# Patient Record
Sex: Male | Born: 1970 | Race: White | Hispanic: No | Marital: Married | Smoking: Never smoker
Health system: Southern US, Community
[De-identification: ages and names within clinical notes are randomized; demographics above are authoritative.]

## PROBLEM LIST (undated history)

## (undated) DIAGNOSIS — E785 Hyperlipidemia, unspecified: Secondary | ICD-10-CM

## (undated) DIAGNOSIS — E119 Type 2 diabetes mellitus without complications: Secondary | ICD-10-CM

## (undated) HISTORY — DX: Type 2 diabetes mellitus without complications: E11.9

## (undated) HISTORY — PX: HERNIA REPAIR: SHX51

## (undated) HISTORY — DX: Hyperlipidemia, unspecified: E78.5

## (undated) HISTORY — PX: VASECTOMY: SHX75

---

## 2011-04-18 ENCOUNTER — Inpatient Hospital Stay (INDEPENDENT_AMBULATORY_CARE_PROVIDER_SITE_OTHER)
Admission: RE | Admit: 2011-04-18 | Discharge: 2011-04-18 | Disposition: A | Discharge status: Home / Self Care | Payer: BC Managed Care – PPO | Source: Ambulatory Visit | Attending: Emergency Medicine | Admitting: Emergency Medicine

## 2011-04-18 DIAGNOSIS — J019 Acute sinusitis, unspecified: Secondary | ICD-10-CM

## 2011-04-18 DIAGNOSIS — J40 Bronchitis, not specified as acute or chronic: Secondary | ICD-10-CM

## 2014-01-06 ENCOUNTER — Emergency Department (HOSPITAL_COMMUNITY)
Admission: EM | Admit: 2014-01-06 | Discharge: 2014-01-06 | Disposition: A | Discharge status: Home / Self Care | Payer: 59 | Source: Home / Self Care

## 2014-01-06 ENCOUNTER — Emergency Department (INDEPENDENT_AMBULATORY_CARE_PROVIDER_SITE_OTHER): Payer: BC Managed Care – PPO

## 2014-01-06 ENCOUNTER — Encounter (HOSPITAL_COMMUNITY): Payer: Self-pay | Admitting: Emergency Medicine

## 2014-01-06 DIAGNOSIS — W101XXA Fall (on)(from) sidewalk curb, initial encounter: Secondary | ICD-10-CM

## 2014-01-06 DIAGNOSIS — Y93E9 Activity, other interior property and clothing maintenance: Secondary | ICD-10-CM

## 2014-01-06 DIAGNOSIS — S2249XA Multiple fractures of ribs, unspecified side, initial encounter for closed fracture: Secondary | ICD-10-CM

## 2014-01-06 DIAGNOSIS — R9389 Abnormal findings on diagnostic imaging of other specified body structures: Secondary | ICD-10-CM

## 2014-01-06 DIAGNOSIS — R918 Other nonspecific abnormal finding of lung field: Secondary | ICD-10-CM

## 2014-01-06 MED ORDER — HYDROCODONE-ACETAMINOPHEN 7.5-325 MG PO TABS: 1.0000 | ORAL_TABLET | ORAL | Status: DC | PRN

## 2014-01-06 NOTE — ED Provider Notes (Signed)
Medical screening examination/treatment/procedure(s) were performed by a resident physician or non-physician practitioner and as the supervising physician I was immediately available for consultation/collaboration.  Lynne Leader, MD    Gregor Hams, MD 01/06/14 [phone]

## 2014-01-06 NOTE — ED Provider Notes (Signed)
CSN: [id number]     Arrival date & time 01/06/14  0904 History   First MD Initiated Contact with Patient 01/06/14 [phone]     Chief Complaint  Patient presents with  . Chest Pain    ribs   (Consider location/radiation/quality/duration/timing/severity/associated sxs/prior Treatment) HPI Comments: 43 year old male states he fell onto the curb 2 weeks ago while taking the trash out. He fell onto his right lateral chest where he incurred rib injury and has been having pain since then. He states the pain has been tolerable during this time until last night when he fell again going up the stairs carrying a laundry basket. He again, fell against the laundry basket with his right anterolateral chest. He points to the mid anterolateral chest wall as the source of pain. Denies shortness of breath it does hurt to take a deep breath. Denies fever or cough.   History reviewed. No pertinent past medical history. History reviewed. No pertinent past surgical history. No family history on file. History  Substance Use Topics  . Smoking status: Not on file  . Smokeless tobacco: Not on file  . Alcohol Use: Not on file    Review of Systems  Constitutional: Positive for activity change.  Respiratory: Negative.  Negative for cough, chest tightness and shortness of breath.   Cardiovascular:       As per history of present illness  Gastrointestinal: Negative.   Genitourinary: Negative.   Musculoskeletal:       As per HPI  Skin: Negative.   Neurological: Negative for dizziness, weakness, numbness and headaches.    Allergies  Review of patient's allergies indicates no known allergies.  Home Medications   Prior to Admission medications   Not on File   BP 124/88  Pulse 78  Temp(Src) 97.5 F (36.4 C) (Oral)  SpO2 96% Physical Exam  Nursing note and vitals reviewed. Constitutional: He is oriented to person, place, and time. He appears well-developed and well-nourished.  HENT:  Head: Normocephalic  and atraumatic.  Eyes: EOM are normal.  Neck: Normal range of motion. Neck supple.  Cardiovascular: Normal rate, regular rhythm and normal heart sounds.   Pulmonary/Chest: Effort normal and breath sounds normal. No respiratory distress. He has no wheezes. He has no rales. He exhibits tenderness.  Tenderness along the right chest wall just inferior to the pectoralis major and extending to the mid axillary line.  Abdominal: Soft. There is no tenderness.  Neurological: He is alert and oriented to person, place, and time. No cranial nerve deficit. He exhibits normal muscle tone.  Skin: Skin is warm and dry.  Psychiatric: He has a normal mood and affect.    ED Course  Procedures (including critical care time) Labs Review Labs Reviewed - No data to display  Imaging Review Dg Ribs Unilateral W/chest Right  01/06/2014   CLINICAL DATA:  Right-sided chest trauma and rib pain.  EXAM: RIGHT RIBS AND CHEST - 3+ VIEW  COMPARISON:  None.  FINDINGS: Minimally displaced fractures of the right anterior fifth and sixth ribs are noted.  No evidence of pneumothorax or hemothorax. Both lungs are clear. Heart size is normal. No evidence of mediastinal widening or tracheal deviation.  Nodular density projecting over the AP window may represent mediastinal mass, lymphadenopathy, or pulmonary nodule. Chest CT with contrast recommended for further evaluation.  IMPRESSION: Minimally displaced fractures of the right anterior fifth and sixth ribs. No evidence of pneumothorax or hemothorax.  Nodular density projecting over the AP window could be due  to mediastinal mass, lymphadenopathy, or pulmonary nodule. Chest CT with contrast recommended for further evaluation.   Electronically Signed   By: Earle Gell M.D.   On: 01/06/2014 10:00     MDM   1. Multiple closed fractures of ribs   2. Abnormal finding on chest xray     Norco for pain #20 Deep breathing Pt aware of mass on CXR, to see PCP next week For fever,  dyspnea, cough or other problems seek medical attn promptly.     Janne Napoleon, NP 01/06/14 1025

## 2014-01-06 NOTE — ED Notes (Signed)
Patient states fell about two weeks ago and injured his right side Yesterday he fell up the steps carrying the laundry basket and re injured the ribs on the  Right side. Today complains of being in pain

## 2014-01-06 NOTE — Discharge Instructions (Signed)
Rib Fracture A rib fracture is a break or crack in one of the bones of the ribs. The ribs are a group of long, curved bones that wrap around your chest and attach to your spine. They protect your lungs and other organs in the chest cavity. A broken or cracked rib is often painful, but most do not cause other problems. Most rib fractures heal on their own over time. However, rib fractures can be more serious if multiple ribs are broken or if broken ribs move out of place and push against other structures. CAUSES   A direct blow to the chest. For example, this could happen during contact sports, a car accident, or a fall against a hard object.  Repetitive movements with high force, such as pitching a baseball or having severe coughing spells. SYMPTOMS   Pain when you breathe in or cough.  Pain when someone presses on the injured area. DIAGNOSIS  Your caregiver will perform a physical exam. Various imaging tests may be ordered to confirm the diagnosis and to look for related injuries. These tests may include a chest X-ray, computed tomography (CT), magnetic resonance imaging (MRI), or a bone scan. TREATMENT  Rib fractures usually heal on their own in 1 3 months. The longer healing period is often associated with a continued cough or other aggravating activities. During the healing period, pain control is very important. Medication is usually given to control pain. Hospitalization or surgery may be needed for more severe injuries, such as those in which multiple ribs are broken or the ribs have moved out of place.  HOME CARE INSTRUCTIONS  Also ibuprofen as needed for pain  Avoid strenuous activity and any activities or movements that cause pain. Be careful during activities and avoid bumping the injured rib.  Gradually increase activity as directed by your caregiver.  Only take over-the-counter or prescription medications as directed by your caregiver. Do not take other medications without asking  your caregiver first.  Apply ice to the injured area for the first 1 2 days after you have been treated or as directed by your caregiver. Applying ice helps to reduce inflammation and pain.  Put ice in a plastic bag.  Place a towel between your skin and the bag.   Leave the ice on for 15 20 minutes at a time, every 2 hours while you are awake.  Perform deep breathing as directed by your caregiver. This will help prevent pneumonia, which is a common complication of a broken rib. Your caregiver may instruct you to:  Take deep breaths several times a day.  Try to cough several times a day, holding a pillow against the injured area.  Use a device called an incentive spirometer to practice deep breathing several times a day.  Drink enough fluids to keep your urine clear or pale yellow. This will help you avoid constipation.   Do not wear a rib belt or binder. These restrict breathing, which can lead to pneumonia.  SEEK IMMEDIATE MEDICAL CARE IF:   You have a fever.   You have difficulty breathing or shortness of breath.   You develop a continual cough, or you cough up thick or bloody sputum.  You feel sick to your stomach (nausea), throw up (vomit), or have abdominal pain.   You have worsening pain not controlled with medications.  MAKE SURE YOU:  Understand these instructions.  Will watch your condition.  Will get help right away if you are not doing well or get  worse. Document Released: 08/16/2005 Document Revised: 04/18/2013 Document Reviewed: 10/18/2012 [facility] Patient Information 2014 [place], [place].

## 2014-01-14 ENCOUNTER — Other Ambulatory Visit: Payer: Self-pay | Admitting: Family Medicine

## 2014-01-14 DIAGNOSIS — R9389 Abnormal findings on diagnostic imaging of other specified body structures: Secondary | ICD-10-CM

## 2014-01-15 ENCOUNTER — Ambulatory Visit
Admission: RE | Admit: 2014-01-15 | Discharge: 2014-01-15 | Disposition: A | Discharge status: Home / Self Care | Payer: 59 | Source: Ambulatory Visit | Attending: Family Medicine | Admitting: Family Medicine

## 2014-01-15 DIAGNOSIS — R9389 Abnormal findings on diagnostic imaging of other specified body structures: Secondary | ICD-10-CM

## 2014-01-15 MED ORDER — IOHEXOL 300 MG/ML  SOLN
75.0000 mL | Freq: Once | INTRAMUSCULAR | Status: AC | PRN
  Administered 2014-01-15: 75 mL via INTRAVENOUS

## 2014-06-03 ENCOUNTER — Other Ambulatory Visit: Payer: Self-pay | Admitting: *Deleted

## 2014-06-03 DIAGNOSIS — I83893 Varicose veins of bilateral lower extremities with other complications: Secondary | ICD-10-CM

## 2014-06-06 ENCOUNTER — Ambulatory Visit (INDEPENDENT_AMBULATORY_CARE_PROVIDER_SITE_OTHER): Payer: 59 | Admitting: Psychiatry

## 2014-06-06 DIAGNOSIS — F1210 Cannabis abuse, uncomplicated: Secondary | ICD-10-CM

## 2014-06-06 DIAGNOSIS — F329 Major depressive disorder, single episode, unspecified: Secondary | ICD-10-CM

## 2014-06-11 ENCOUNTER — Ambulatory Visit (INDEPENDENT_AMBULATORY_CARE_PROVIDER_SITE_OTHER): Payer: 59 | Admitting: Psychiatry

## 2014-06-11 ENCOUNTER — Ambulatory Visit: Payer: 59 | Admitting: Professional

## 2014-06-11 DIAGNOSIS — F1010 Alcohol abuse, uncomplicated: Secondary | ICD-10-CM

## 2014-06-11 DIAGNOSIS — F329 Major depressive disorder, single episode, unspecified: Secondary | ICD-10-CM

## 2014-06-20 ENCOUNTER — Ambulatory Visit (INDEPENDENT_AMBULATORY_CARE_PROVIDER_SITE_OTHER): Payer: 59 | Admitting: Psychiatry

## 2014-06-20 DIAGNOSIS — F329 Major depressive disorder, single episode, unspecified: Secondary | ICD-10-CM

## 2014-06-20 DIAGNOSIS — F1010 Alcohol abuse, uncomplicated: Secondary | ICD-10-CM

## 2014-06-24 ENCOUNTER — Encounter: Payer: Self-pay | Admitting: Vascular Surgery

## 2014-06-25 ENCOUNTER — Encounter (HOSPITAL_COMMUNITY): Payer: 59

## 2014-06-25 ENCOUNTER — Encounter: Payer: 59 | Admitting: Vascular Surgery

## 2014-07-04 ENCOUNTER — Ambulatory Visit (INDEPENDENT_AMBULATORY_CARE_PROVIDER_SITE_OTHER): Payer: 59 | Admitting: Psychiatry

## 2014-07-04 DIAGNOSIS — F39 Unspecified mood [affective] disorder: Secondary | ICD-10-CM

## 2014-07-04 DIAGNOSIS — F1010 Alcohol abuse, uncomplicated: Secondary | ICD-10-CM

## 2016-01-27 IMAGING — CT CT CHEST W/ CM
2 of 3 series · 15 of 36 positions shown, 18 images · IV contrast (75CC OMNI 300)
Comparison: Chest radiograph January 06, 2014

CLINICAL DATA: Pulmonary nodular lesions seen on chest radiograph

EXAM:
CT CHEST WITH CONTRAST
TECHNIQUE: Multidetector CT imaging of the chest was performed during
intravenous contrast administration.
CONTRAST:  75mL OMNIPAQUE IOHEXOL 300 MG/ML  SOLN

[Series 2: chest with · axial · 0.76mm/px · z∈[-336,-61]mm · 12 of 65 slices shown, 15 images]
[im 5/65  mediastinal]
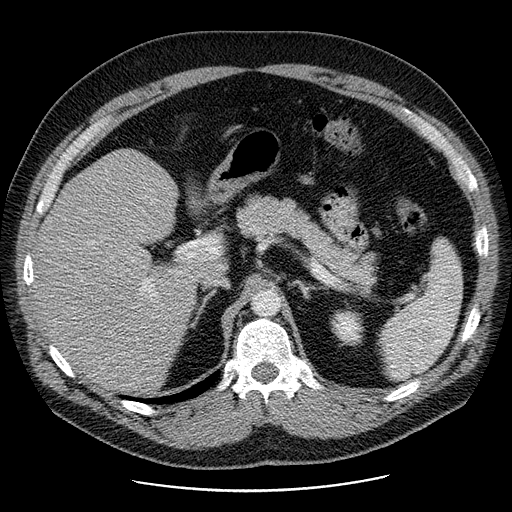
[im 5/65  lung]
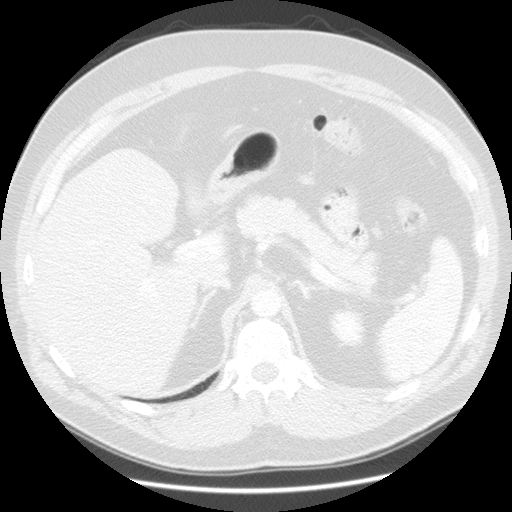
[im 10/65  lung]
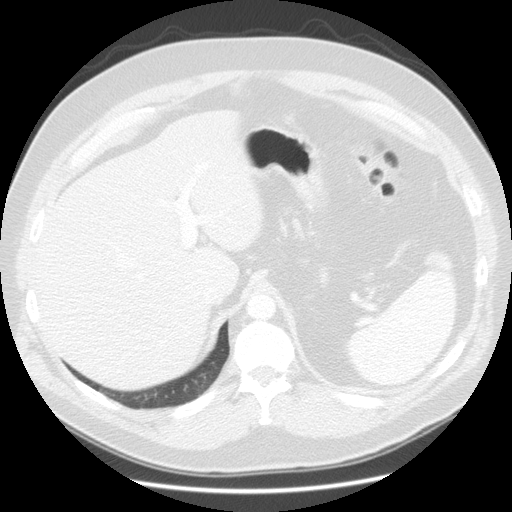
[im 15/65  lung]
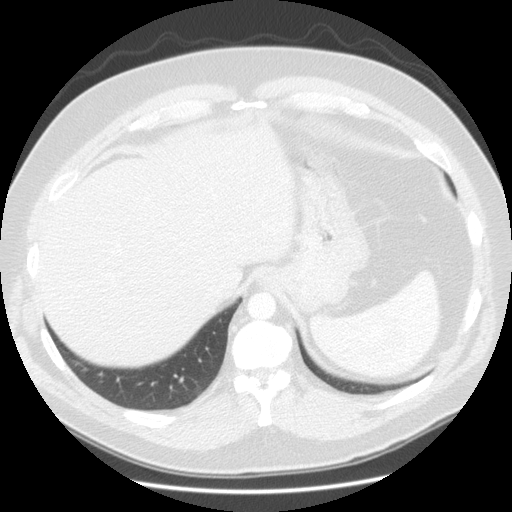
[im 19/65  lung]
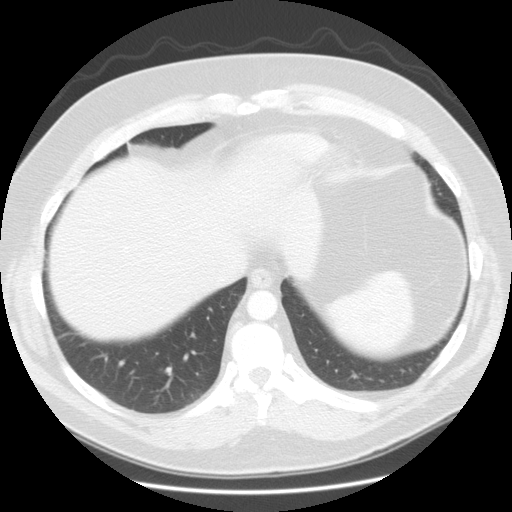
[im 24/65  mediastinal]
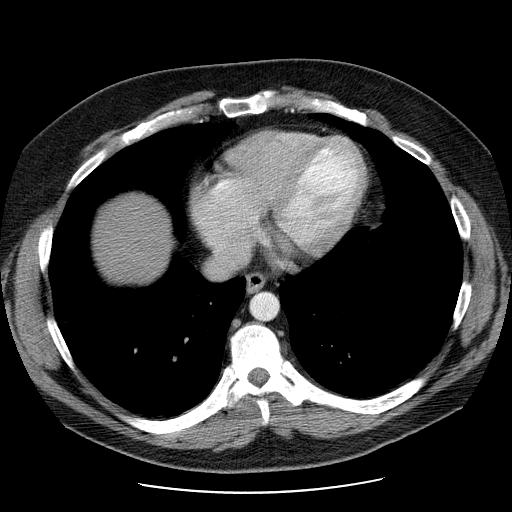
[im 24/65  lung]
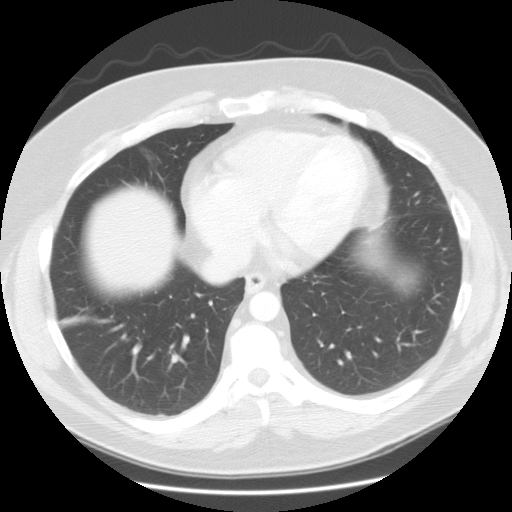
[im 29/65  lung]
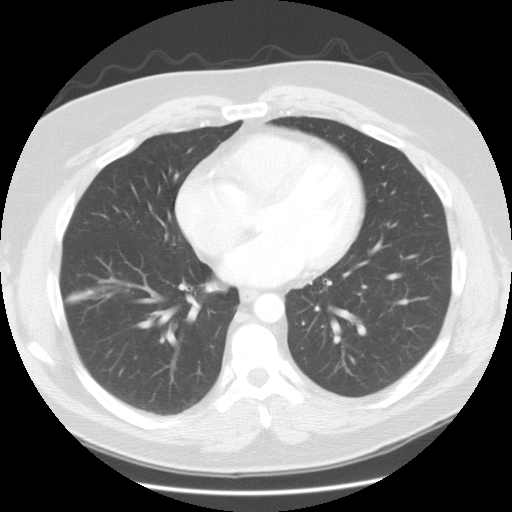
[im 36/65  lung]
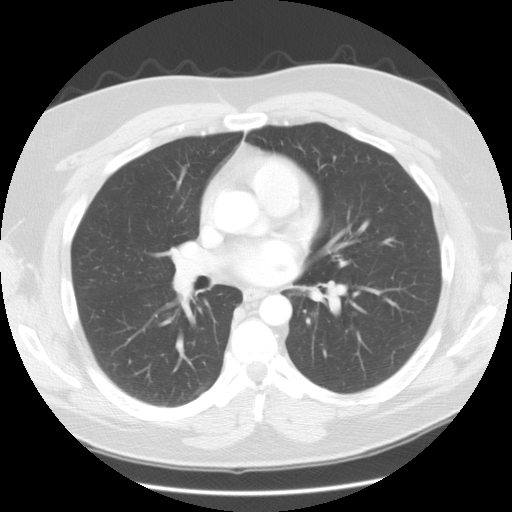
[im 41/65  lung]
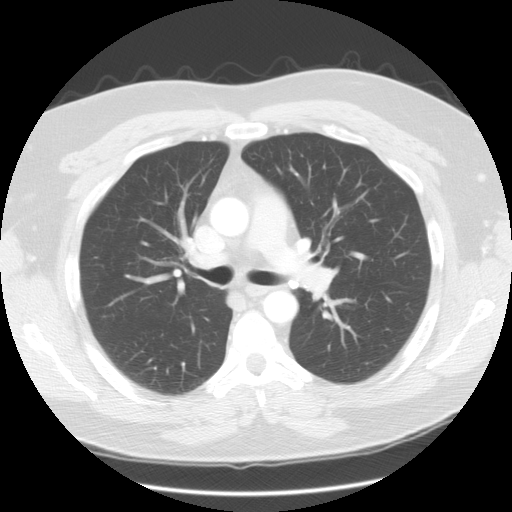
[im 46/65  mediastinal]
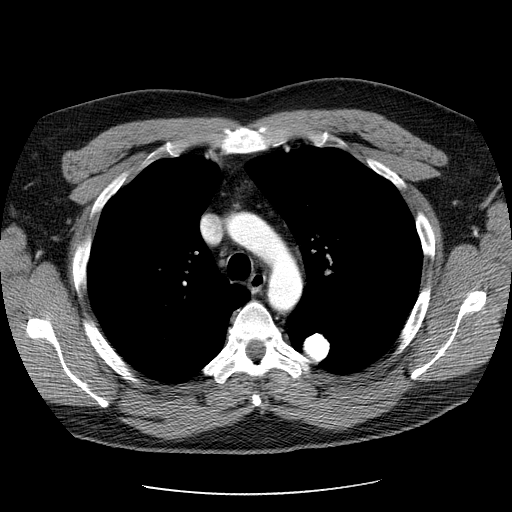
[im 46/65  lung]
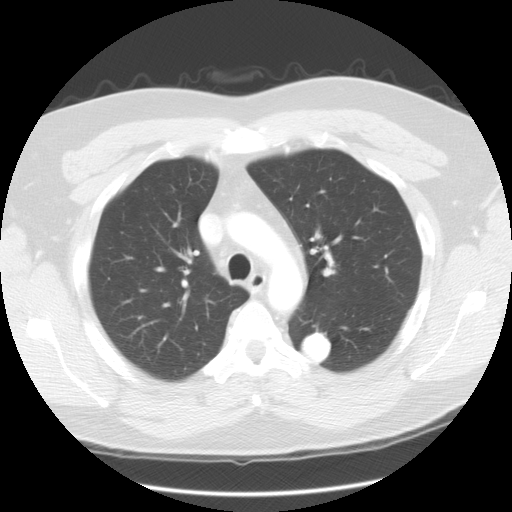
[im 50/65  lung]
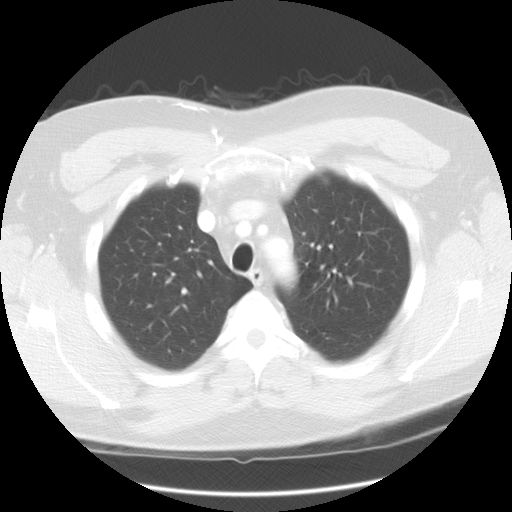
[im 55/65  lung]
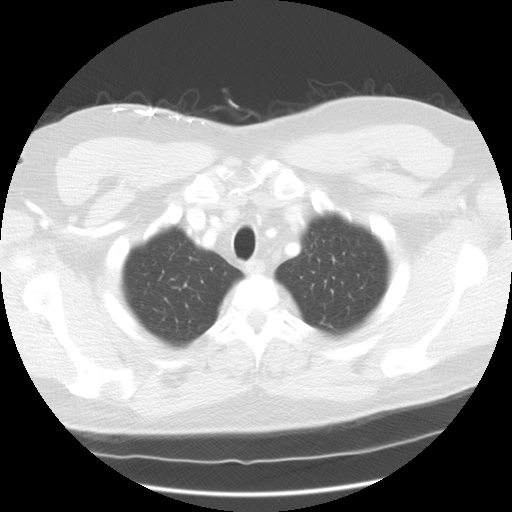
[im 60/65  lung]
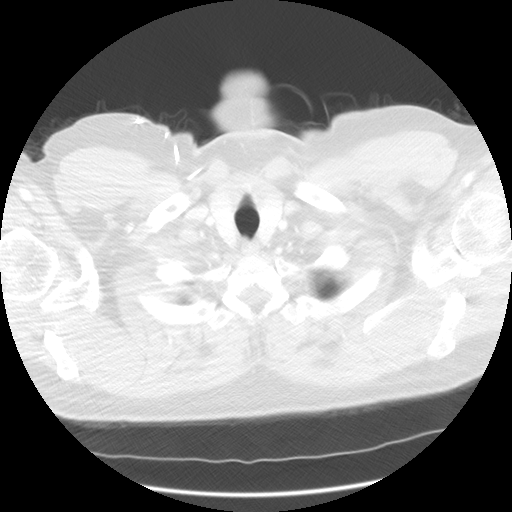

[Series 402: sag · coronal · 0.76mm/px · 3 of 157 slices shown]
[im 32/157  lung]
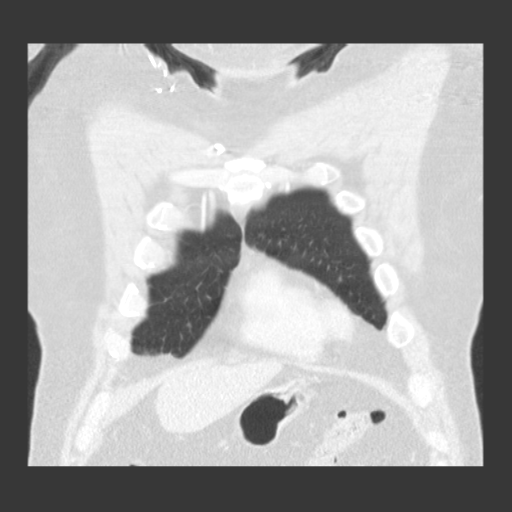
[im 63/157  lung]
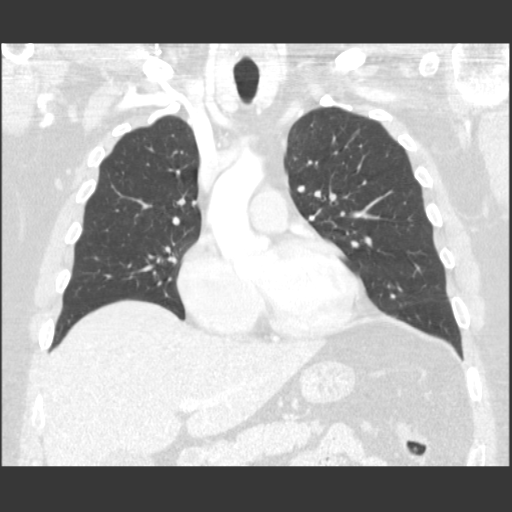
[im 94/157  lung]
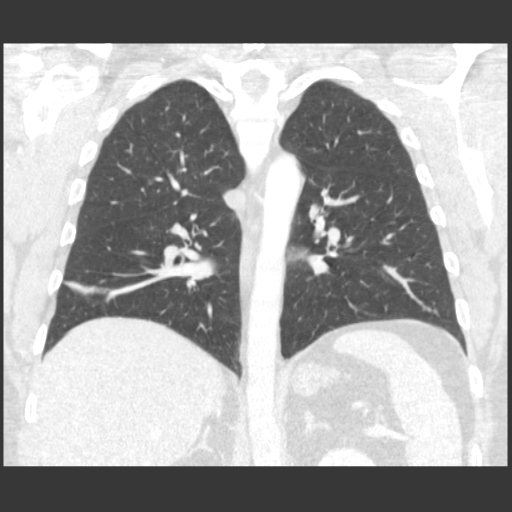

[15 of 36 positions shown; findings below may reference images not displayed]

FINDINGS: There is a 2.1 x 2.1 cm calcified granuloma in the posterior segment
of the left upper lobe which corresponds to the opacity seen on
recent chest radiograph. There is a small area of scarring in the
posterior segment of the left upper lobe. There is mild scarring in
the right base. Elsewhere, lungs are clear.

There is no appreciable thoracic adenopathy. The pericardium is not
thickened.

Visualized upper abdominal structures appear within normal limits.
There are no blastic or lytic bone lesions. Thyroid appears normal.
IMPRESSION: The area question on recent chest radiograph represents a calcified
granuloma in the posterior segment of the left upper lobe. Areas of
mild scarring are noted bilaterally. There is no edema or
consolidation. No adenopathy.

## 2016-11-03 DIAGNOSIS — R05 Cough: Secondary | ICD-10-CM | POA: Diagnosis not present

## 2016-11-03 DIAGNOSIS — J209 Acute bronchitis, unspecified: Secondary | ICD-10-CM | POA: Diagnosis not present

## 2016-11-03 DIAGNOSIS — R911 Solitary pulmonary nodule: Secondary | ICD-10-CM | POA: Diagnosis not present

## 2017-05-08 DIAGNOSIS — R45851 Suicidal ideations: Secondary | ICD-10-CM | POA: Diagnosis not present

## 2017-05-08 DIAGNOSIS — F29 Unspecified psychosis not due to a substance or known physiological condition: Secondary | ICD-10-CM | POA: Diagnosis not present

## 2017-05-08 DIAGNOSIS — Z79899 Other long term (current) drug therapy: Secondary | ICD-10-CM | POA: Diagnosis not present

## 2017-05-08 DIAGNOSIS — F4321 Adjustment disorder with depressed mood: Secondary | ICD-10-CM | POA: Diagnosis not present

## 2017-05-08 DIAGNOSIS — Z79891 Long term (current) use of opiate analgesic: Secondary | ICD-10-CM | POA: Diagnosis not present

## 2017-05-08 DIAGNOSIS — F329 Major depressive disorder, single episode, unspecified: Secondary | ICD-10-CM | POA: Diagnosis not present

## 2017-05-08 DIAGNOSIS — Z638 Other specified problems related to primary support group: Secondary | ICD-10-CM | POA: Diagnosis not present

## 2017-05-08 DIAGNOSIS — R739 Hyperglycemia, unspecified: Secondary | ICD-10-CM | POA: Diagnosis not present

## 2017-05-08 DIAGNOSIS — G4700 Insomnia, unspecified: Secondary | ICD-10-CM | POA: Diagnosis not present

## 2017-05-11 ENCOUNTER — Ambulatory Visit (INDEPENDENT_AMBULATORY_CARE_PROVIDER_SITE_OTHER): Payer: BLUE CROSS/BLUE SHIELD | Admitting: Osteopathic Medicine

## 2017-05-11 ENCOUNTER — Encounter: Payer: Self-pay | Admitting: Osteopathic Medicine

## 2017-05-11 VITALS — BP 122/76 | HR 79 | Ht 73.0 in | Wt 243.0 lb

## 2017-05-11 DIAGNOSIS — E1165 Type 2 diabetes mellitus with hyperglycemia: Secondary | ICD-10-CM | POA: Diagnosis not present

## 2017-05-11 DIAGNOSIS — R7303 Prediabetes: Secondary | ICD-10-CM

## 2017-05-11 DIAGNOSIS — F332 Major depressive disorder, recurrent severe without psychotic features: Secondary | ICD-10-CM

## 2017-05-11 DIAGNOSIS — IMO0001 Reserved for inherently not codable concepts without codable children: Secondary | ICD-10-CM

## 2017-05-11 DIAGNOSIS — Z23 Encounter for immunization: Secondary | ICD-10-CM

## 2017-05-11 DIAGNOSIS — IMO0002 Reserved for concepts with insufficient information to code with codable children: Secondary | ICD-10-CM | POA: Insufficient documentation

## 2017-05-11 LAB — POCT GLYCOSYLATED HEMOGLOBIN (HGB A1C): Hemoglobin A1C: 12.3

## 2017-05-11 MED ORDER — BLOOD GLUCOSE MONITOR KIT: PACK | 0 refills | Status: AC

## 2017-05-11 MED ORDER — LIRAGLUTIDE 18 MG/3ML ~~LOC~~ SOPN: PEN_INJECTOR | SUBCUTANEOUS | 3 refills | Status: DC

## 2017-05-11 MED ORDER — METFORMIN HCL ER 500 MG PO TB24: 500.0000 mg | ORAL_TABLET | Freq: Two times a day (BID) | ORAL | 1 refills | Status: DC

## 2017-05-11 MED ORDER — BUPROPION HCL ER (XL) 150 MG PO TB24: 150.0000 mg | ORAL_TABLET | Freq: Every day | ORAL | 1 refills | Status: DC

## 2017-05-11 NOTE — Patient Instructions (Addendum)
Plan:  Diabetes - metformin (start once per day and increase as tolerated to twice per day) and liraglutide (see dosing instructions on prescription) and referral to dietician, see below. Goal fasting sugars 80-130.   Depression - wellbutrin and referral to psychiatry and counseling. If ever in crisis: 911, national suicide hotline [phone]), closest emergency room      Carbohydrate Counting for Diabetes Mellitus, Adult Carbohydrate counting is a method for keeping track of how many carbohydrates you eat. Eating carbohydrates naturally increases the amount of sugar (glucose) in the blood. Counting how many carbohydrates you eat helps keep your blood glucose within normal limits, which helps you manage your diabetes (diabetes mellitus). It is important to know how many carbohydrates you can safely have in each meal. This is different for every person. A diet and nutrition specialist (registered dietitian) can help you make a meal plan and calculate how many carbohydrates you should have at each meal and snack. Carbohydrates are found in the following foods:  Grains, such as breads and cereals.  Dried beans and soy products.  Starchy vegetables, such as potatoes, peas, and corn.  Fruit and fruit juices.  Milk and yogurt.  Sweets and snack foods, such as cake, cookies, candy, chips, and soft drinks.  How do I count carbohydrates? There are two ways to count carbohydrates in food. You can use either of the methods or a combination of both. Reading "Nutrition Facts" on packaged food The "Nutrition Facts" list is included on the labels of almost all packaged foods and beverages in the [place] It includes:  The serving size.  Information about nutrients in each serving, including the grams (g) of carbohydrate per serving.  To use the "Nutrition Facts":  Decide how many servings you will have.  Multiply the number of servings by the number of carbohydrates per serving.  The  resulting number is the total amount of carbohydrates that you will be having.  Learning standard serving sizes of other foods When you eat foods containing carbohydrates that are not packaged or do not include "Nutrition Facts" on the label, you need to measure the servings in order to count the amount of carbohydrates:  Measure the foods that you will eat with a food scale or measuring cup, if needed.  Decide how many standard-size servings you will eat.  Multiply the number of servings by 15. Most carbohydrate-rich foods have about 15 g of carbohydrates per serving. ? For example, if you eat 8 oz (170 g) of strawberries, you will have eaten 2 servings and 30 g of carbohydrates (2 servings x 15 g = 30 g).  For foods that have more than one food mixed, such as soups and casseroles, you must count the carbohydrates in each food that is included.  The following list contains standard serving sizes of common carbohydrate-rich foods. Each of these servings has about 15 g of carbohydrates:   hamburger bun or  English muffin.   oz (15 mL) syrup.   oz (14 g) jelly.  1 slice of bread.  1 six-inch tortilla.  3 oz (85 g) cooked rice or pasta.  4 oz (113 g) cooked dried beans.  4 oz (113 g) starchy vegetable, such as peas, corn, or potatoes.  4 oz (113 g) hot cereal.  4 oz (113 g) mashed potatoes or  of a large baked potato.  4 oz (113 g) canned or frozen fruit.  4 oz (120 mL) fruit juice.  4-6 crackers.  6 chicken nuggets.  6 oz (170 g) unsweetened dry cereal.  6 oz (170 g) plain fat-free yogurt or yogurt sweetened with artificial sweeteners.  8 oz (240 mL) milk.  8 oz (170 g) fresh fruit or one small piece of fruit.  24 oz (680 g) popped popcorn.  Example of carbohydrate counting Sample meal  3 oz (85 g) chicken breast.  6 oz (170 g) brown rice.  4 oz (113 g) corn.  8 oz (240 mL) milk.  8 oz (170 g) strawberries with sugar-free whipped  topping. Carbohydrate calculation 1. Identify the foods that contain carbohydrates: ? Rice. ? Corn. ? Milk. ? Strawberries. 2. Calculate how many servings you have of each food: ? 2 servings rice. ? 1 serving corn. ? 1 serving milk. ? 1 serving strawberries. 3. Multiply each number of servings by 15 g: ? 2 servings rice x 15 g = 30 g. ? 1 serving corn x 15 g = 15 g. ? 1 serving milk x 15 g = 15 g. ? 1 serving strawberries x 15 g = 15 g. 4. Add together all of the amounts to find the total grams of carbohydrates eaten: ? 30 g + 15 g + 15 g + 15 g = 75 g of carbohydrates total. This information is not intended to replace advice given to you by your health care provider. Make sure you discuss any questions you have with your health care provider. Document Released: 08/16/2005 Document Revised: 03/05/2016 Document Reviewed: 01/28/2016 Elsevier Interactive Patient Education  [employer].

## 2017-05-11 NOTE — Progress Notes (Signed)
HPI: Roy Meyers is a 46 y.o. male  who presents to [place] today, 05/11/17,  for chief complaint of:  Chief Complaint  Patient presents with  . Establish Care    New patient here to establish care  Concern for diabetes: Family history of prediabetes in father. He has some concerns about some nerve pain that he has been having in his legs. A1c as below does diagnosis diabetes today.  Depression: Ongoing on and off for several years but has reached a breaking point with significant stress levels and mood problems over the past week or so. Was evaluated in the emergency department last weekend, released. Has 3 children, motivated to seek counseling and no suicidal thoughts at this time. He is answering the PHQ9 questions based on feelings over the past 2 weeks but currently no suicidal thoughts or plan. Good family support system   Past medical, surgical, social and family history reviewed: Patient Active Problem List   Diagnosis Date Noted  . Uncontrolled type 2 diabetes mellitus without complication, without long-term current use of insulin ([place]) 05/11/2017  . Severe episode of recurrent major depressive disorder, without psychotic features () 05/11/2017   Past Surgical History:  Procedure Laterality Date  . HERNIA REPAIR     Social History  Substance Use Topics  . Smoking status: Never Smoker  . Smokeless tobacco: Never Used  . Alcohol use Not on file   Family History  Problem Relation Age of Onset  . Alcohol abuse Mother   . Cancer Mother   . Diabetes Father      Current medication list and allergy/intolerance information reviewed:   No current outpatient prescriptions on file.   No current facility-administered medications for this visit.    No Known Allergies    Review of Systems:  Constitutional:  No  fever, no chills, No recent illness, No unintentional weight changes. No significant fatigue.   HEENT: No  headache,  no vision change, no hearing change, No sore throat, No  sinus pressure  Cardiac: No  chest pain, No  pressure, No palpitations, No  Orthopnea  Respiratory:  No  shortness of breath. No  Cough  Gastrointestinal: No  abdominal pain, No  nausea, No  vomiting,  No  blood in stool, No  diarrhea, No  constipation   Musculoskeletal: No new myalgia/arthralgia  Genitourinary: No  incontinence, No  abnormal genital bleeding, No abnormal genital discharge  Skin: No  Rash, No other wounds/concerning lesions  Hem/Onc: No  easy bruising/bleeding, No  abnormal lymph node  Endocrine: No cold intolerance,  No heat intolerance. No polyuria/polydipsia/polyphagia   Neurologic: No  weakness, No  dizziness, No  slurred speech/focal weakness/facial droop  Psychiatric: +concerns with depression, No  concerns with anxiety, No sleep problems, No mood problems  Exam:  BP 122/76   Pulse 79   Ht 6' 1"  (1.854 m)   Wt 243 lb (110.2 kg)   BMI 32.06 kg/m   Constitutional: VS see above. General Appearance: alert, well-developed, well-nourished, NAD  Neck: No masses, trachea midline. No thyroid enlargement.   Respiratory: Normal respiratory effort. no wheeze, no rhonchi, no rales  Cardiovascular: S1/S2 normal, no murmur, no rub/gallop auscultated. RRR. No lower extremity edema.   Musculoskeletal: Gait normal.   Neurological: Normal balance/coordination. No tremor.   Skin: warm, dry, intact. No rash/ulcer.  Psychiatric: Normal judgment/insight. Normal mood and affect. Oriented x3.    Results for orders placed or performed in visit on 05/11/17 (from  the past 72 hour(s))  POCT HgB A1C     Status: None   Collection Time: 05/11/17  2:04 PM  Result Value Ref Range   Hemoglobin A1C 12.3       ASSESSMENT/PLAN: A diagnosis of diabetes, particularly elevated A1c, will confirm with serum blood draw. Will go ahead and initiate double therapy, likely will need triple, we'll see how well he tolerates  addition of these medications in addition to an antidepressant. Safety plan discussed regarding depression, patient is motivated to get help and I do not believe he is a danger to himself.  Severe episode of recurrent major depressive disorder, without psychotic features ([place]) - Plan: buPROPion (WELLBUTRIN XL) 150 MG 24 hr tablet, Ambulatory referral to [place], TSH, Testosterone  Uncontrolled type 2 diabetes mellitus without complication, without long-term current use of insulin ([place]) - Plan: metFORMIN (GLUCOPHAGE-XR) 500 MG 24 hr tablet, liraglutide 18 MG/3ML SOPN, blood glucose meter kit and supplies KIT, Lipid panel, Hemoglobin A1c, Amb ref to Medical Nutrition Therapy-MNT  Need for immunization against influenza - Plan: Flu Vaccine QUAD 36+ mos IM  Pre-diabetes - Plan: POCT HgB A1C    Patient Instructions  Plan:  Diabetes - metformin (start once per day and increase as tolerated to twice per day) and liraglutide (see dosing instructions on prescription) and referral to dietician, see below. Goal fasting sugars 80-130.   Depression - wellbutrin and referral to psychiatry and counseling. If ever in crisis: 911, national suicide hotline [phone]), closest emergency room      Visit summary with medication list and pertinent instructions was printed for patient to review. All questions at time of visit were answered - patient instructed to contact office with any additional concerns. ER/RTC precautions were reviewed with the patient. Follow-up plan: Return for recheck diabetes and depressionin 2-4 weeks, sooner if needed or if problems with medications .  Note: Total time spent 45 minutes, greater than 50% of the visit was spent face-to-face counseling and coordinating care for the following: The primary encounter diagnosis was Need for immunization against influenza. A diagnosis of Pre-diabetes was also pertinent to this visit.Marland Kitchen

## 2017-05-14 LAB — TSH: TSH: 1.24 mIU/L (ref 0.40–4.50)

## 2017-05-14 LAB — HEMOGLOBIN A1C
EAG (MMOL/L): 13.8 (calc)
HEMOGLOBIN A1C: 10.3 %{Hb} — AB (ref <5.7)
Mean Plasma Glucose: 249 (calc)

## 2017-05-14 LAB — LIPID PANEL
CHOLESTEROL: 225 mg/dL — AB (ref <200)
HDL: 40 mg/dL — AB (ref >40)
LDL Cholesterol (Calc): 134 mg/dL (calc) — ABNORMAL HIGH
Non-HDL Cholesterol (Calc): 185 mg/dL (calc) — ABNORMAL HIGH (ref <130)
Total CHOL/HDL Ratio: 5.6 (calc) — ABNORMAL HIGH (ref <5.0)
Triglycerides: 357 mg/dL — ABNORMAL HIGH (ref <150)

## 2017-05-14 LAB — TESTOSTERONE: Testosterone: 209 ng/dL — ABNORMAL LOW (ref 250–827)

## 2017-05-25 ENCOUNTER — Encounter: Payer: Self-pay | Admitting: Osteopathic Medicine

## 2017-05-25 ENCOUNTER — Ambulatory Visit (INDEPENDENT_AMBULATORY_CARE_PROVIDER_SITE_OTHER): Payer: BLUE CROSS/BLUE SHIELD | Admitting: Osteopathic Medicine

## 2017-05-25 VITALS — BP 122/84 | HR 96 | Ht 73.0 in | Wt 238.0 lb

## 2017-05-25 DIAGNOSIS — F332 Major depressive disorder, recurrent severe without psychotic features: Secondary | ICD-10-CM

## 2017-05-25 DIAGNOSIS — R7989 Other specified abnormal findings of blood chemistry: Secondary | ICD-10-CM

## 2017-05-25 DIAGNOSIS — E1165 Type 2 diabetes mellitus with hyperglycemia: Secondary | ICD-10-CM

## 2017-05-25 DIAGNOSIS — IMO0001 Reserved for inherently not codable concepts without codable children: Secondary | ICD-10-CM

## 2017-05-25 DIAGNOSIS — E1169 Type 2 diabetes mellitus with other specified complication: Secondary | ICD-10-CM | POA: Diagnosis not present

## 2017-05-25 DIAGNOSIS — E785 Hyperlipidemia, unspecified: Secondary | ICD-10-CM

## 2017-05-25 MED ORDER — ATORVASTATIN CALCIUM 40 MG PO TABS: 40.0000 mg | ORAL_TABLET | Freq: Every day | ORAL | 3 refills | Status: DC

## 2017-05-25 MED ORDER — BUPROPION HCL ER (XL) 300 MG PO TB24: 300.0000 mg | ORAL_TABLET | Freq: Every day | ORAL | 1 refills | Status: DC

## 2017-05-25 MED ORDER — LIRAGLUTIDE 18 MG/3ML ~~LOC~~ SOPN: 1.8000 mg | PEN_INJECTOR | Freq: Every day | SUBCUTANEOUS | 3 refills | Status: DC

## 2017-05-25 NOTE — Patient Instructions (Signed)
Plan:  Cholesterol medicine  Increase Wellbutrin  Refill pens and needles  Labs for testosterone and if still low will order shots - nurse visit to get first one

## 2017-05-25 NOTE — Progress Notes (Signed)
HPI: Roy Meyers is a 46 y.o. male  who presents to [facility] today, 05/25/17,  for chief complaint of:  Chief Complaint  Patient presents with  . Follow-up    LABS AND DISCUSS INCREASING ANTIDEPRESSANT MEDICATION   Diabetes: Recent diagnosis with A1c just above 10%. Patient would like to give lifestyle modifications and non-insulin medications a chance to work before going to insulin. He is open to statin medications.  Depression: Ongoing on and off for several years but has reached a breaking point with significant stress levels and mood problems as well as marital separation. Has 3 children, motivated to seek counseling and no suicidal thoughts. He is tolerating Wellbutrin okay but not noticing much difference in terms of mood issues, would like to go up on this medication.  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 Patient is interested in possible replacement/further workup.   Past medical, surgical, social and family history reviewed: Patient Active Problem List   Diagnosis Date Noted  . Uncontrolled type 2 diabetes mellitus without complication, without long-term current use of insulin ([place]) 05/11/2017  . Severe episode of recurrent major depressive disorder, without psychotic features ([facility]) 05/11/2017   Past Surgical History:  Procedure Laterality Date  . HERNIA REPAIR    . VASECTOMY     Social History  Substance Use Topics  . Smoking status: Never Smoker  . Smokeless tobacco: Never Used  . Alcohol use Not on file   Family History  Problem Relation Age of Onset  . Alcohol abuse Mother   . Cancer Mother   . Diabetes Father      Current medication list and allergy/intolerance information reviewed:   Current Outpatient Prescriptions  Medication Sig Dispense Refill  . blood glucose meter kit and supplies KIT Dispense based on patient and insurance preference. Use as directed up to bid. Dx:  E11.65 1 each 0  . buPROPion (WELLBUTRIN XL) 150 MG 24 hr tablet Take 1 tablet (150 mg total) by mouth daily. 30 tablet 1  . liraglutide 18 MG/3ML SOPN SubQ: Initial: 0.6 mg once daily for 1 week; then increase to 1.2 mg once daily for 1 week; then increase further to 1.8 mg once daily. 9 mL 3  . metFORMIN (GLUCOPHAGE-XR) 500 MG 24 hr tablet Take 1 tablet (500 mg total) by mouth 2 (two) times daily. 60 tablet 1   No current facility-administered medications for this visit.    No Known Allergies    Review of Systems:  Constitutional:  No  fever, no chills, No recent illness,  HEENT: No  headache  Cardiac: No  chest pain, No  pressure, No palpitations  Respiratory:  No  shortness of breath. No  Cough  Gastrointestinal: No  abdominal pain, No  nausea  Musculoskeletal: No new myalgia/arthralgia  Endocrine: No cold intolerance,  No heat intolerance. No polyuria/polydipsia/polyphagia   Neurologic: No  weakness, No  dizziness,  Psychiatric: +concerns with depression, No  concerns with anxiety, No sleep problems, No mood problems  Exam:  BP 122/84   Pulse 96   Ht _0  (1.854 m)   Wt 238 lb (108 kg)   BMI 31.40 kg/m   Constitutional: VS see above. General Appearance: alert, well-developed, well-nourished, NAD  Neck: No masses, trachea midline. No thyroid enlargement.   Respiratory: Normal respiratory effort. no wheeze, no rhonchi, no rales  Cardiovascular: S1/S2 normal, no murmur, no rub/gallop auscultated. RRR. No lower extremity edema.   Musculoskeletal:  Gait normal.   Neurological: Normal balance/coordination. No tremor.   Skin: warm, dry, intact. No rash/ulcer.  Psychiatric: Normal judgment/insight. Normal mood and affect. Oriented x3.    Recent Results (from the past 2160 hour(s))  POCT HgB A1C     Status: None   Collection Time: 05/11/17  2:04 PM  Result Value Ref Range   Hemoglobin A1C 12.3   Lipid panel     Status: Abnormal   Collection Time: 05/13/17   9:44 AM  Result Value Ref Range   Cholesterol 225 (H) <200 mg/dL   HDL 40 (L) >40 mg/dL   Triglycerides 357 (H) <150 mg/dL   LDL Cholesterol (Calc) 134 (H) mg/dL (calc)    Comment: Reference range: <100 . Desirable range <100 mg/dL for primary prevention;   <70 mg/dL for patients with CHD or diabetic patients  with > or = 2 CHD risk factors. Marland Kitchen LDL-C is now calculated using the Martin-Hopkins  calculation, which is a validated novel method providing  better accuracy than the Friedewald equation in the  estimation of LDL-C.  Cresenciano Genre et al. Annamaria Helling. 4696;295(28): 2061-2068  (http://education.[employer].com/faq/FAQ164)    Total CHOL/HDL Ratio 5.6 (H) <5.0 (calc)   Non-HDL Cholesterol (Calc) 185 (H) <130 mg/dL (calc)    Comment: For patients with diabetes plus 1 major ASCVD risk  factor, treating to a non-HDL-C goal of <100 mg/dL  (LDL-C of <70 mg/dL) is considered a therapeutic  option.   TSH     Status: None   Collection Time: 05/13/17  9:44 AM  Result Value Ref Range   TSH 1.24 0.40 - 4.50 mIU/L  Hemoglobin A1c     Status: Abnormal   Collection Time: 05/13/17  9:44 AM  Result Value Ref Range   Hgb A1c MFr Bld 10.3 (H) <5.7 % of total Hgb    Comment: For someone without known diabetes, a hemoglobin A1c value of 6.5% or greater indicates that they may have  diabetes and this should be confirmed with a follow-up  test. . For someone with known diabetes, a value <7% indicates  that their diabetes is well controlled and a value  greater than or equal to 7% indicates suboptimal  control. A1c targets should be individualized based on  duration of diabetes, age, comorbid conditions, and  other considerations. . Currently, no consensus exists regarding use of hemoglobin A1c for diagnosis of diabetes for children. .    Mean Plasma Glucose 249 (calc)   eAG (mmol/L) 13.8 (calc)  Testosterone     Status: Abnormal   Collection Time: 05/13/17  9:44 AM  Result Value Ref Range    Testosterone 209 (L) 250 - 827 ng/dL    Comment: In hypogonadal males, Testosterone, Total, LC/MS/MS, is the recommended assay due to the diminished accuracy of immunoassay at levels below 250 ng/dL. This test code [phone]) must be collected in a red-top tube with no gel.        ASSESSMENT/PLAN:   Relatively new diagnosis of diabetes, particularly elevated A1c,we initiated double therapy, likely will need triple, we'll see how well he tolerates titrating up on these medications in addition to an antidepressant.   Safety plan reviewed regarding depression, patient is motivated to get help. I believe significant testosterone deficiency may also be contributing to depression/mood issues. Will go ahead and confirm low testosterone levels and consider injections if can confirm low  Low testosterone - Plan: PSA, Total with Reflex to PSA, Free, FSH/LH, Testos,Total,Free and SHBG (Male), CANCELED: Testos,Total,Free and SHBG (  Male)  Severe episode of recurrent major depressive disorder, without psychotic features ([place]) - Plan: buPROPion (WELLBUTRIN XL) 300 MG 24 hr tablet  Uncontrolled type 2 diabetes mellitus without complication, without long-term current use of insulin ([facility]) - Plan: liraglutide 18 MG/3ML SOPN, atorvastatin (LIPITOR) 40 MG tablet    Patient Instructions  Plan:  Cholesterol medicine  Increase Wellbutrin  Refill pens and needles  Labs for testosterone and if still low will order shots - nurse visit to get first one      Visit summary with medication list and pertinent instructions was printed for patient to review. All questions at time of visit were answered - patient instructed to contact office with any additional concerns. ER/RTC precautions were reviewed with the patient. Follow-up plan: Return for review labs, discuss testosterone, review home sugar levels .  Note: Total time spent 25 minutes, greater than 50% of the visit was spent face-to-face counseling  and coordinating care for the following: The primary encounter diagnosis was Low testosterone. Diagnoses of Severe episode of recurrent major depressive disorder, without psychotic features ([place]) and Uncontrolled type 2 diabetes mellitus without complication, without long-term current use of insulin ([place]) were also pertinent to this visit.Marland Kitchen

## 2017-05-30 LAB — PSA, TOTAL WITH REFLEX TO PSA, FREE: PSA, Total: 0.8 ng/mL (ref <=4.0)

## 2017-05-30 LAB — FSH/LH
FSH: 3 m[IU]/mL (ref 1.6–8.0)
LH: 3.9 m[IU]/mL (ref 1.5–9.3)

## 2017-05-31 LAB — TESTOS,TOTAL,FREE AND SHBG (FEMALE)
Free Testosterone: 60.2 pg/mL (ref 35.0–155.0)
Sex Hormone Binding: 11 nmol/L (ref 10–50)
Testosterone, Total, LC-MS-MS: 317 ng/dL (ref 250–1100)

## 2017-06-01 ENCOUNTER — Encounter: Payer: BLUE CROSS/BLUE SHIELD | Attending: Osteopathic Medicine | Admitting: Skilled Nursing Facility1

## 2017-06-01 ENCOUNTER — Encounter: Payer: Self-pay | Admitting: Skilled Nursing Facility1

## 2017-06-01 ENCOUNTER — Ambulatory Visit (INDEPENDENT_AMBULATORY_CARE_PROVIDER_SITE_OTHER): Payer: BLUE CROSS/BLUE SHIELD | Admitting: Osteopathic Medicine

## 2017-06-01 VITALS — BP 118/77 | HR 92 | Ht 73.0 in | Wt 236.8 lb

## 2017-06-01 DIAGNOSIS — IMO0001 Reserved for inherently not codable concepts without codable children: Secondary | ICD-10-CM

## 2017-06-01 DIAGNOSIS — Z713 Dietary counseling and surveillance: Secondary | ICD-10-CM | POA: Insufficient documentation

## 2017-06-01 DIAGNOSIS — E119 Type 2 diabetes mellitus without complications: Secondary | ICD-10-CM | POA: Diagnosis not present

## 2017-06-01 DIAGNOSIS — E1165 Type 2 diabetes mellitus with hyperglycemia: Secondary | ICD-10-CM

## 2017-06-01 DIAGNOSIS — E349 Endocrine disorder, unspecified: Secondary | ICD-10-CM | POA: Diagnosis not present

## 2017-06-01 LAB — POCT UA - MICROALBUMIN
Creatinine, POC: 300 mg/dL
MICROALBUMIN (UR) POC: 80 mg/L

## 2017-06-01 MED ORDER — "NEEDLE (DISP) 18G X 1"" MISC": 0 refills | Status: DC

## 2017-06-01 MED ORDER — TESTOSTERONE CYPIONATE 100 MG/ML IM SOLN: 100.0000 mg | INTRAMUSCULAR | 5 refills | Status: DC

## 2017-06-01 MED ORDER — "NEEDLE (DISP) 22G X 1-1/2"" MISC": 0 refills | Status: DC

## 2017-06-01 MED ORDER — SYRINGE LUER LOCK 3 ML MISC: 1 refills | Status: AC

## 2017-06-01 NOTE — Patient Instructions (Signed)
Plan:  Repeat testosterone level as early as possible Friday AM at lab (before 10:00 AM)  Will trial testosterone injections - please schedule nurse visit for education on injections and to get your first shot - please schedule this after your lab draw on Friday

## 2017-06-01 NOTE — Progress Notes (Signed)
Pt states he is testing his blood sugar every morning at 6am fasting. 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 Pt states he gets so stressed he does not eat. Pt staets he drinks a gallon of water a day. Pt states he is having a lot of mental/emotional stress culmonating in a need to be hospitalized.  Diabetes Self-Management Education  Visit Type: First/Initial   06/01/2017  Mr. Yon Schiffman, identified by name and date of birth, is a 46 y.o. male with a diagnosis of Diabetes: Type 2.   ASSESSMENT  Height 6' 1"  (1.854 m), weight 235 lb 8 oz (106.8 kg). Body mass index is 31.07 kg/m.      Diabetes Self-Management Education - 06/01/17 1103      Visit Information   Visit Type First/Initial     Initial Visit   Diabetes Type Type 2   Are you currently following a meal plan? No   Are you taking your medications as prescribed? Yes     Health Coping   How would you rate your overall health? Good     Psychosocial Assessment   Patient Belief/Attitude about Diabetes Afraid     Pre-Education Assessment   Patient understands the diabetes disease and treatment process. Needs Instruction   Patient understands incorporating nutritional management into lifestyle. Needs Instruction   Patient undertands incorporating physical activity into lifestyle. Needs Instruction   Patient understands using medications safely. Needs Instruction   Patient understands monitoring blood glucose, interpreting and using results Needs Instruction   Patient understands prevention, detection, and treatment of acute complications. Needs Instruction   Patient understands prevention, detection, and treatment of chronic complications. Needs Instruction   Patient understands how to develop strategies to address psychosocial issues. Needs Instruction   Patient understands how to develop strategies to promote health/change behavior. Needs Instruction     Complications   Last HgB A1C per patient/outside source 10 %    How often do you check your blood sugar? 1-2 times/day   Fasting Blood glucose range (mg/dL) 130-179   Number of hypoglycemic episodes per month 1   Can you tell when your blood sugar is low? No   Number of hyperglycemic episodes per week 0   Have you had a dilated eye exam in the past 12 months? Yes   Have you had a dental exam in the past 12 months? No   Are you checking your feet? No     Dietary Intake   Breakfast greek yogurt and meat and cheese roll   Snack (morning) chicken nuggets or lettuce wrap   Snack (afternoon) hamburger patty or BBQ   Dinner califlower and chicken   Beverage(s) coffee, beer, wine, water      Exercise   Exercise Type ADL's     Patient Education   Previous Diabetes Education No   Disease state  Definition of diabetes, type 1 and 2, and the diagnosis of diabetes;Factors that contribute to the development of diabetes;Explored patient's options for treatment of their diabetes   Nutrition management  Role of diet in the treatment of diabetes and the relationship between the three main macronutrients and blood glucose level;Food label reading, portion sizes and measuring food.;Carbohydrate counting;Reviewed blood glucose goals for pre and post meals and how to evaluate the patients' food intake on their blood glucose level.;Meal timing in regards to the patients' current diabetes medication.;Meal options for control of blood glucose level and chronic complications.   Physical activity and exercise  Role of exercise on  diabetes management, blood pressure control and cardiac health.;Identified with patient nutritional and/or medication changes necessary with exercise.   Monitoring Taught/evaluated SMBG meter.;Purpose and frequency of SMBG.;Yearly dilated eye exam;Daily foot exams   Acute complications Taught treatment of hypoglycemia - the 15 rule.   Chronic complications Assessed and discussed foot care and prevention of foot problems;Relationship between chronic  complications and blood glucose control;Lipid levels, blood glucose control and heart disease;Dental care;Retinopathy and reason for yearly dilated eye exams   Psychosocial adjustment Worked with patient to identify barriers to care and solutions;Role of stress on diabetes;Helped patient identify a support system for diabetes management;Identified and addressed patients feelings and concerns about diabetes;Brainstormed with patient on coping mechanisms for social situations, getting support from significant others, dealing with feelings about diabetes     Individualized Goals (developed by patient)   Nutrition Follow meal plan discussed;General guidelines for healthy choices and portions discussed;Adjust meds/carbs with exercise as discussed   Physical Activity Exercise 3-5 times per week;30 minutes per day   Medications take my medication as prescribed   Monitoring  test my blood glucose as discussed;test blood glucose pre and post meals as discussed     Post-Education Assessment   Patient understands the diabetes disease and treatment process. Demonstrates understanding / competency   Patient understands incorporating nutritional management into lifestyle. Demonstrates understanding / competency   Patient undertands incorporating physical activity into lifestyle. Demonstrates understanding / competency   Patient understands using medications safely. Demonstrates understanding / competency   Patient understands monitoring blood glucose, interpreting and using results Demonstrates understanding / competency   Patient understands prevention, detection, and treatment of acute complications. Demonstrates understanding / competency   Patient understands prevention, detection, and treatment of chronic complications. Demonstrates understanding / competency   Patient understands how to develop strategies to address psychosocial issues. Demonstrates understanding / competency   Patient understands how to  develop strategies to promote health/change behavior. Demonstrates understanding / competency     Outcomes   Expected Outcomes Demonstrated interest in learning. Expect positive outcomes   Future DMSE PRN   Program Status Completed      Individualized Plan for Diabetes Self-Management Training:   Learning Objective:  Patient will have a greater understanding of diabetes self-management. Patient education plan is to attend individual and/or group sessions per assessed needs and concerns.   Plan:   -Check your feet every day -Check your blood sugar at random times throughout the day and week -Look for different ways to control your stress  Expected Outcomes:  Demonstrated interest in learning. Expect positive outcomes  Education material provided: Living Well with Diabetes, My Plate, Snack sheet and Support group flyer  If problems or questions, patient to contact team via:  Phone  Future DSME appointment: PRN

## 2017-06-01 NOTE — Progress Notes (Signed)
HPI: Roy Meyers is a 46 y.o. male  who presents to [facility] today, 06/01/17,  for chief complaint of:  Chief Complaint  Patient presents with  . Follow-up    Here today to Discuss lab results. Recent testosterone was low, confirmatory testing was drawn a bit later in the morning then ideal. Low but still just above 300.  Would like to discuss testosterone replacement, she is also having symptoms of testosterone deficiency as far as depression and libido are concerned.   Past medical history, surgical history, social history and family history reviewed.  Patient Active Problem List   Diagnosis Date Noted  . Hyperlipidemia associated with type 2 diabetes mellitus ([place]) 05/25/2017  . Low testosterone 05/25/2017  . Uncontrolled type 2 diabetes mellitus without complication, without long-term current use of insulin ([place]) 05/11/2017  . Severe episode of recurrent major depressive disorder, without psychotic features ([place]) 05/11/2017    Current medication list and allergy/intolerance information reviewed.   Current Outpatient Prescriptions on File Prior to Visit  Medication Sig Dispense Refill  . atorvastatin (LIPITOR) 40 MG tablet Take 1 tablet (40 mg total) by mouth daily. 90 tablet 3  . blood glucose meter kit and supplies KIT Dispense based on patient and insurance preference. Use as directed up to bid. Dx: E11.65 1 each 0  . buPROPion (WELLBUTRIN XL) 300 MG 24 hr tablet Take 1 tablet (300 mg total) by mouth daily. 90 tablet 1  . liraglutide 18 MG/3ML SOPN Inject 0.3 mLs (1.8 mg total) into the skin daily. Please dispense necessary pens for this medicine - contact office if separate Rx needed 9 mL 3  . metFORMIN (GLUCOPHAGE-XR) 500 MG 24 hr tablet Take 1 tablet (500 mg total) by mouth 2 (two) times daily. 60 tablet 1   No current facility-administered medications on file prior to visit.    No Known Allergies    Review of  Systems:  Constitutional: No recent illness  HEENT: No  headache, no vision change  Cardiac: No  chest pain, No  pressure, No palpitations  Respiratory:  No  shortness of breath. No  Cough  Gastrointestinal: No  abdominal pain, no change on bowel habits  Musculoskeletal: No new myalgia/arthralgia  Skin: No  Rash  Hem/Onc: No  easy bruising/bleeding, No  abnormal lumps/bumps  Neurologic: No  weakness, No  Dizziness  Psychiatric: No  concerns with depression, No  concerns with anxiety  Exam:  BP 118/77   Pulse 92   Ht 6' 1"  (1.854 m)   Wt 236 lb 12.8 oz (107.4 kg)   SpO2 97%   BMI 31.24 kg/m   Constitutional: VS see above. General Appearance: alert, well-developed, well-nourished, NAD  Eyes: Normal lids and conjunctive, non-icteric sclera  Ears, Nose, Mouth, Throat: MMM, Normal external inspection ears/nares/mouth/lips/gums.  Neck: No masses, trachea midline.   Respiratory: Normal respiratory effort. no wheeze, no rhonchi, no rales  Cardiovascular: S1/S2 normal, no murmur, no rub/gallop auscultated. RRR.   Musculoskeletal: Gait normal. Symmetric and independent movement of all extremities  Neurological: Normal balance/coordination. No tremor.  Skin: warm, dry, intact.   Psychiatric: Normal judgment/insight. Normal mood and affect. Oriented x3.    Recent Results (from the past 2160 hour(s))  POCT HgB A1C     Status: None   Collection Time: 05/11/17  2:04 PM  Result Value Ref Range   Hemoglobin A1C 12.3   Lipid panel     Status: Abnormal   Collection Time: 05/13/17  9:44  AM  Result Value Ref Range   Cholesterol 225 (H) <200 mg/dL   HDL 40 (L) >40 mg/dL   Triglycerides 357 (H) <150 mg/dL   LDL Cholesterol (Calc) 134 (H) mg/dL (calc)    Comment: Reference range: <100 . Desirable range <100 mg/dL for primary prevention;   <70 mg/dL for patients with CHD or diabetic patients  with > or = 2 CHD risk factors. Marland Kitchen LDL-C is now calculated using the  Martin-Hopkins  calculation, which is a validated novel method providing  better accuracy than the Friedewald equation in the  estimation of LDL-C.  Cresenciano Genre et al. Annamaria Helling. 4503;888(28): 2061-2068  (http://education.Quest[facility].com/faq/FAQ164)    Total CHOL/HDL Ratio 5.6 (H) <5.0 (calc)   Non-HDL Cholesterol (Calc) 185 (H) <130 mg/dL (calc)    Comment: For patients with diabetes plus 1 major ASCVD risk  factor, treating to a non-HDL-C goal of <100 mg/dL  (LDL-C of <70 mg/dL) is considered a therapeutic  option.   TSH     Status: None   Collection Time: 05/13/17  9:44 AM  Result Value Ref Range   TSH 1.24 0.40 - 4.50 mIU/L  Hemoglobin A1c     Status: Abnormal   Collection Time: 05/13/17  9:44 AM  Result Value Ref Range   Hgb A1c MFr Bld 10.3 (H) <5.7 % of total Hgb    Comment: For someone without known diabetes, a hemoglobin A1c value of 6.5% or greater indicates that they may have  diabetes and this should be confirmed with a follow-up  test. . For someone with known diabetes, a value <7% indicates  that their diabetes is well controlled and a value  greater than or equal to 7% indicates suboptimal  control. A1c targets should be individualized based on  duration of diabetes, age, comorbid conditions, and  other considerations. . Currently, no consensus exists regarding use of hemoglobin A1c for diagnosis of diabetes for children. .    Mean Plasma Glucose 249 (calc)   eAG (mmol/L) 13.8 (calc)  Testosterone     Status: Abnormal   Collection Time: 05/13/17  9:44 AM  Result Value Ref Range   Testosterone 209 (L) 250 - 827 ng/dL    Comment: In hypogonadal males, Testosterone, Total, LC/MS/MS, is the recommended assay due to the diminished accuracy of immunoassay at levels below 250 ng/dL. This test code [phone]) must be collected in a red-top tube with no gel.    Testos,Total,Free and SHBG (Male)     Status: None   Collection Time: 05/27/17  9:16 AM  Result Value  Ref Range   Testosterone, Total, LC-MS-MS 317 250 - 1,100 ng/dL    Comment: . For additional information, please refer to http://education.questdiagnostics.com/faq/ TotalTestosteroneLCMSMSFAQ165 (This link is being provided for informational/ educational purposes only.) . This test was developed and its analytical performance characteristics have been determined by [place], [place]. It has not been cleared or approved by the U.S. Food and Drug Administration. This assay has been validated pursuant to the CLIA regulations and is used for clinical purposes. .    Free Testosterone 60.2 35.0 - 155.0 pg/mL    Comment: . This test was developed and its analytical performance characteristics have been determined by [place], [place]. It has not been cleared or approved by the U.S. Food and Drug Administration. This assay has been validated pursuant to the CLIA regulations and is used for clinical purposes. .    Sex Hormone Binding 11 10 -  50 nmol/L  PSA, Total with Reflex to PSA, Free     Status: None   Collection Time: 05/27/17  9:20 AM  Result Value Ref Range   PSA, Total 0.8 < OR = 4.0 ng/mL    Comment: The Total PSA value from this assay system is  standardized against the equimolar PSA standard.  The test result will be approximately 20% higher  when compared to the [facility] Total PSA  (Siemens assay). Comparison of serial PSA results  should be interpreted with this fact in mind. Marland Kitchen PSA was performed using the Beckman Coulter  Immunoassay method. Values obtained from different  assay methods cannot be used interchangeably. PSA  levels, regardless of value, should not be  interpreted as absolute evidence of the presence or  absence of disease.   FSH/LH     Status: None   Collection Time: 05/27/17  9:20 AM  Result Value Ref Range   FSH 3.0 1.6 - 8.0 mIU/mL   LH 3.9 1.5 - 9.3 mIU/mL    No  results found.  No flowsheet data found.  Depression screen [facility] 2/9 06/01/2017 05/11/2017  Decreased Interest 2 3  Down, Depressed, Hopeless - 3  PHQ - 2 Score 2 6  Altered sleeping 3 3  Tired, decreased energy 3 3  Change in appetite 3 3  Feeling bad or failure about yourself  2 3  Trouble concentrating 3 2  Moving slowly or fidgety/restless 0 0  Suicidal thoughts 1 2  PHQ-9 Score 17 22       ASSESSMENT/PLAN:   Discussed risk of possible complications from testosterone replacement including prostate issues, sleep apnea, erythrocytosis, TTE, increased cardiovascular risk. Discussed gel versus patch versus injection and importance of routine lab monitoring  Testosterone deficiency - Plan: testosterone cypionate (DEPOTESTOTERONE CYPIONATE) 100 MG/ML injection, Testosterone  Uncontrolled type 2 diabetes mellitus without complication, without long-term current use of insulin ([place]) - Concern for microalbuminuria but defer ACE/ARB for now given relatively low blood pressure - will discuss in detail at next visit - Plan: POCT UA - Microalbumin    Patient Instructions  Plan:  Repeat testosterone level as early as possible Friday AM at lab (before 10:00 AM)  Will trial testosterone injections - please schedule nurse visit for education on injections and to get your first shot - please schedule this after your lab draw on Friday     Follow-up plan: Return for nurse visit and labs - see instructions .  Visit summary with medication list and pertinent instructions was printed for patient to review, alert [place] if any changes needed. All questions at time of visit were answered - patient instructed to contact office with any additional concerns. ER/RTC precautions were reviewed with the patient and understanding verbalized.   Note: Total time spent 25 minutes, greater than 50% of the visit was spent face-to-face counseling and coordinating care for the following: The primary encounter  diagnosis was Testosterone deficiency. A diagnosis of Uncontrolled type 2 diabetes mellitus without complication, without long-term current use of insulin (HCC) was also pertinent to this visit.Marland Kitchen

## 2017-06-03 ENCOUNTER — Ambulatory Visit (INDEPENDENT_AMBULATORY_CARE_PROVIDER_SITE_OTHER): Payer: BLUE CROSS/BLUE SHIELD | Admitting: Physician Assistant

## 2017-06-03 VITALS — BP 105/75 | HR 86

## 2017-06-03 DIAGNOSIS — R7989 Other specified abnormal findings of blood chemistry: Secondary | ICD-10-CM

## 2017-06-03 NOTE — Progress Notes (Signed)
Patient came into clinic today for education on administration of Testosterone. Pt brought the medication into office today, and had his own needles/syringes. Went into great detail on administration locations, how to apply/remove needles, how to dial up dose (184m/ml), administration of injection, and proper disposal of used needles. Stressed the importance of needle safety and went over risk of infection possibilities or contamination that could occur if needles aren't changed after every injection. Pt states he would change the needle each time. Pt was able to administer the injection in office today, no complications. No further questions. Advised Pt to contact office if he has any further questions or concerns, verbalized understanding.   Agree with above plan. JIran PlanasPA-C

## 2017-06-04 LAB — TESTOSTERONE: Testosterone: 249 ng/dL — ABNORMAL LOW (ref 250–827)

## 2017-06-24 ENCOUNTER — Ambulatory Visit (HOSPITAL_COMMUNITY): Payer: BLUE CROSS/BLUE SHIELD | Admitting: Licensed Clinical Social Worker

## 2017-07-08 ENCOUNTER — Other Ambulatory Visit: Payer: Self-pay | Admitting: Osteopathic Medicine

## 2017-07-08 DIAGNOSIS — E1165 Type 2 diabetes mellitus with hyperglycemia: Principal | ICD-10-CM

## 2017-07-08 DIAGNOSIS — IMO0001 Reserved for inherently not codable concepts without codable children: Secondary | ICD-10-CM

## 2017-07-10 ENCOUNTER — Other Ambulatory Visit: Payer: Self-pay | Admitting: Osteopathic Medicine

## 2017-07-10 DIAGNOSIS — F332 Major depressive disorder, recurrent severe without psychotic features: Secondary | ICD-10-CM

## 2017-08-24 ENCOUNTER — Other Ambulatory Visit: Payer: Self-pay | Admitting: Osteopathic Medicine

## 2017-08-24 DIAGNOSIS — E349 Endocrine disorder, unspecified: Secondary | ICD-10-CM

## 2017-09-26 ENCOUNTER — Telehealth: Payer: Self-pay | Admitting: Osteopathic Medicine

## 2017-09-26 DIAGNOSIS — E1165 Type 2 diabetes mellitus with hyperglycemia: Principal | ICD-10-CM

## 2017-09-26 DIAGNOSIS — IMO0001 Reserved for inherently not codable concepts without codable children: Secondary | ICD-10-CM

## 2017-09-26 DIAGNOSIS — F332 Major depressive disorder, recurrent severe without psychotic features: Secondary | ICD-10-CM

## 2017-09-26 DIAGNOSIS — E1169 Type 2 diabetes mellitus with other specified complication: Secondary | ICD-10-CM

## 2017-09-26 DIAGNOSIS — E785 Hyperlipidemia, unspecified: Secondary | ICD-10-CM

## 2017-09-26 DIAGNOSIS — R7989 Other specified abnormal findings of blood chemistry: Secondary | ICD-10-CM

## 2017-09-26 NOTE — Telephone Encounter (Signed)
Routing to PCP to see what labs are required.

## 2017-09-26 NOTE — Telephone Encounter (Signed)
Pt has an appointment on Feb 4th and would like to come in on Wednesday Jan 30th to have lab work done.

## 2017-09-27 NOTE — Telephone Encounter (Signed)
orders in. He is due to recheck testosterone levels, needs to get blood drawn fasting midway between injections if at all possible.

## 2017-09-27 NOTE — Telephone Encounter (Signed)
Attempted to contact Pt, no answer and VM is full.

## 2017-10-01 LAB — COMPLETE METABOLIC PANEL WITH GFR
AG RATIO: 2.3 (calc) (ref 1.0–2.5)
ALBUMIN MSPROF: 4.4 g/dL (ref 3.6–5.1)
ALT: 21 U/L (ref 9–46)
AST: 17 U/L (ref 10–40)
Alkaline phosphatase (APISO): 63 U/L (ref 40–115)
BUN: 11 mg/dL (ref 7–25)
CALCIUM: 9.1 mg/dL (ref 8.6–10.3)
CO2: 30 mmol/L (ref 20–32)
CREATININE: 0.95 mg/dL (ref 0.60–1.35)
Chloride: 105 mmol/L (ref 98–110)
GFR, EST AFRICAN AMERICAN: 110 mL/min/{1.73_m2} (ref >=60)
GFR, Est Non African American: 95 mL/min/{1.73_m2} (ref >=60)
GLOBULIN: 1.9 g/dL (ref 1.9–3.7)
Glucose, Bld: 124 mg/dL — ABNORMAL HIGH (ref 65–99)
Potassium: 4.3 mmol/L (ref 3.5–5.3)
SODIUM: 139 mmol/L (ref 135–146)
TOTAL PROTEIN: 6.3 g/dL (ref 6.1–8.1)
Total Bilirubin: 0.7 mg/dL (ref 0.2–1.2)

## 2017-10-01 LAB — CBC
HCT: 44 % (ref 38.5–50.0)
HEMOGLOBIN: 15.4 g/dL (ref 13.2–17.1)
MCH: 32 pg (ref 27.0–33.0)
MCHC: 35 g/dL (ref 32.0–36.0)
MCV: 91.5 fL (ref 80.0–100.0)
MPV: 10.6 fL (ref 7.5–12.5)
Platelets: 206 10*3/uL (ref 140–400)
RBC: 4.81 10*6/uL (ref 4.20–5.80)
RDW: 12.5 % (ref 11.0–15.0)
WBC: 8.3 10*3/uL (ref 3.8–10.8)

## 2017-10-01 LAB — HEMOGLOBIN A1C
Hgb A1c MFr Bld: 6.1 % of total Hgb — ABNORMAL HIGH (ref <5.7)
MEAN PLASMA GLUCOSE: 128 (calc)
eAG (mmol/L): 7.1 (calc)

## 2017-10-01 LAB — LIPID PANEL
CHOL/HDL RATIO: 2.7 (calc) (ref <5.0)
Cholesterol: 141 mg/dL (ref <200)
HDL: 53 mg/dL (ref >40)
LDL Cholesterol (Calc): 64 mg/dL (calc)
Non-HDL Cholesterol (Calc): 88 mg/dL (calc) (ref <130)
Triglycerides: 154 mg/dL — ABNORMAL HIGH (ref <150)

## 2017-10-01 LAB — TESTOSTERONE: Testosterone: 277 ng/dL (ref 250–827)

## 2017-10-03 ENCOUNTER — Ambulatory Visit: Payer: BLUE CROSS/BLUE SHIELD | Admitting: Osteopathic Medicine

## 2017-10-03 ENCOUNTER — Encounter: Payer: Self-pay | Admitting: Osteopathic Medicine

## 2017-10-03 VITALS — BP 127/74 | HR 92 | Temp 97.9°F | Wt 229.1 lb

## 2017-10-03 DIAGNOSIS — F324 Major depressive disorder, single episode, in partial remission: Secondary | ICD-10-CM | POA: Diagnosis not present

## 2017-10-03 DIAGNOSIS — E349 Endocrine disorder, unspecified: Secondary | ICD-10-CM | POA: Insufficient documentation

## 2017-10-03 DIAGNOSIS — E119 Type 2 diabetes mellitus without complications: Secondary | ICD-10-CM

## 2017-10-03 DIAGNOSIS — R7989 Other specified abnormal findings of blood chemistry: Secondary | ICD-10-CM

## 2017-10-03 DIAGNOSIS — J019 Acute sinusitis, unspecified: Secondary | ICD-10-CM

## 2017-10-03 MED ORDER — LIRAGLUTIDE 18 MG/3ML ~~LOC~~ SOPN: 1.8000 mg | PEN_INJECTOR | Freq: Every day | SUBCUTANEOUS | 3 refills | Status: DC

## 2017-10-03 MED ORDER — METFORMIN HCL ER 500 MG PO TB24: 1000.0000 mg | ORAL_TABLET | Freq: Every day | ORAL | 3 refills | Status: DC

## 2017-10-03 MED ORDER — TESTOSTERONE CYPIONATE 100 MG/ML IM SOLN: 100.0000 mg | INTRAMUSCULAR | 0 refills | Status: DC

## 2017-10-03 MED ORDER — BUPROPION HCL ER (XL) 300 MG PO TB24: 300.0000 mg | ORAL_TABLET | Freq: Every day | ORAL | 1 refills | Status: DC

## 2017-10-03 MED ORDER — "NEEDLE (DISP) 22G X 1-1/2"" MISC": 99 refills | Status: AC

## 2017-10-03 MED ORDER — "NEEDLE (DISP) 18G X 1"" MISC": 99 refills | Status: AC

## 2017-10-03 MED ORDER — AMOXICILLIN-POT CLAVULANATE 875-125 MG PO TABS: 1.0000 | ORAL_TABLET | Freq: Two times a day (BID) | ORAL | 0 refills | Status: DC

## 2017-10-03 NOTE — Telephone Encounter (Signed)
Attempted to contact Pt, no answer and VM is full.

## 2017-10-03 NOTE — Progress Notes (Signed)
HPI: Roy Meyers is a 47 y.o. male who  has a past medical history of Diabetes mellitus without complication ([place]) and Hyperlipidemia.  he presents to [facility] today, 10/03/17,  for chief complaint of: Follow-up diabetes, cholesterol, testosterone  Diabetes type 2: Doing a lot better, A1c has shown significant improvement. He has managed to be successful with weight loss as well.  Lipidemia: Improved on statin, he is tolerating it fine and liver enzymes are normal.  Hypogonadism: Testosterone levels are still on the low side, he is on 100 mg every 2 weeks. He states that overall his energy levels have improved.  History of depression: For some reason Wellbutrin was significantly expensive for him, he has been off of it for a little bit. Would like to get back on this medication  Sinus: Ongoing sinus infection/pressure for about 2 weeks now. No cough, no fever.     Past medical, surgical, social and family history reviewed:  Patient Active Problem List   Diagnosis Date Noted  . Hyperlipidemia associated with type 2 diabetes mellitus ([place]) 05/25/2017  . Low testosterone 05/25/2017  . Uncontrolled type 2 diabetes mellitus without complication, without long-term current use of insulin ([place]) 05/11/2017  . Severe episode of recurrent major depressive disorder, without psychotic features ([place]) 05/11/2017    Past Surgical History:  Procedure Laterality Date  . HERNIA REPAIR    . VASECTOMY      Social History   Tobacco Use  . Smoking status: Never Smoker  . Smokeless tobacco: Never Used  Substance Use Topics  . Alcohol use: Not on file    Family History  Problem Relation Age of Onset  . Alcohol abuse Mother   . Cancer Mother   . Diabetes Father      Current medication list and allergy/intolerance information reviewed:    Current Outpatient Medications  Medication Sig Dispense Refill  . atorvastatin (LIPITOR) 40 MG tablet Take 1  tablet (40 mg total) by mouth daily. 90 tablet 3  . blood glucose meter kit and supplies KIT Dispense based on patient and insurance preference. Use as directed up to bid. Dx: E11.65 1 each 0  . buPROPion (WELLBUTRIN XL) 300 MG 24 hr tablet Take 1 tablet (300 mg total) by mouth daily. 90 tablet 1  . liraglutide (VICTOZA) 18 MG/3ML SOPN Inject 0.3 mLs (1.8 mg total) into the skin daily. Please dispense necessary pens for this medicine - contact office if separate Rx needed 27 mL 3  . metFORMIN (GLUCOPHAGE-XR) 500 MG 24 hr tablet Take 2 tablets (1,000 mg total) by mouth daily with breakfast. 180 tablet 3  . NEEDLE, DISP, 18 G 18G X 1" MISC Use with testosterone as directed to draw 100 each 99  . NEEDLE, DISP, 22 G 22G X 1-1/2" MISC Use with testosterone as directed to inject 100 each 99  . Syringe, Disposable, (SYRINGE LUER LOCK) 3 ML MISC Use with testosterone as directed 100 each 1  . testosterone cypionate (DEPOTESTOTERONE CYPIONATE) 100 MG/ML injection Inject 1 mL (100 mg total) into the muscle every 7 (seven) days. For IM use only 10 mL 0  . amoxicillin-clavulanate (AUGMENTIN) 875-125 MG tablet Take 1 tablet by mouth 2 (two) times daily. 14 tablet 0   No current facility-administered medications for this visit.     No Known Allergies    Review of Systems:  Constitutional:  No  fever, no chills, +recent illness, No unintentional weight changes. No significant fatigue.   HEENT:  No  headache, no vision change, no hearing change, No sore throat, +sinus pressure  Cardiac: No  chest pain, No  pressure, No palpitations, No  Orthopnea  Respiratory:  No  shortness of breath. No  Cough  Gastrointestinal: No  abdominal pain, No  nausea  Musculoskeletal: No new myalgia/arthralgia  Skin: No  Rash  Hem/Onc: No  easy bruising/bleeding, No  abnormal lymph node  Endocrine: No cold intolerance,  No heat intolerance. No polyuria/polydipsia/polyphagia   Exam:  BP 127/74   Pulse 92   Temp 97.9  F (36.6 C) (Oral)   Wt 229 lb 1.3 oz (103.9 kg)   BMI 30.22 kg/m   Constitutional: VS see above. General Appearance: alert, well-developed, well-nourished, NAD  Eyes: Normal lids and conjunctive, non-icteric sclera  Ears, Nose, Mouth, Throat: MMM, Normal external inspection ears/nares/mouth/lips/gums.  Neck: No masses, trachea midline.  Respiratory: Normal respiratory effort. no wheeze, no rhonchi, no rales  Cardiovascular: S1/S2 normal, no murmur, no rub/gallop auscultated. RRR.   Musculoskeletal: Gait normal.   Neurological: Normal balance/coordination. No tremor.   Skin: warm, dry, intact.   Psychiatric: Normal judgment/insight. Normal mood and affect. Oriented x3.    Recent Results (from the past 2160 hour(s))  CBC     Status: None   Collection Time: 09/30/17  8:15 AM  Result Value Ref Range   WBC 8.3 3.8 - 10.8 Thousand/uL   RBC 4.81 4.20 - 5.80 Million/uL   Hemoglobin 15.4 13.2 - 17.1 g/dL   HCT 44.0 38.5 - 50.0 %   MCV 91.5 80.0 - 100.0 fL   MCH 32.0 27.0 - 33.0 pg   MCHC 35.0 32.0 - 36.0 g/dL   RDW 12.5 11.0 - 15.0 %   Platelets 206 140 - 400 Thousand/uL   MPV 10.6 7.5 - 12.5 fL  COMPLETE METABOLIC PANEL WITH GFR     Status: Abnormal   Collection Time: 09/30/17  8:15 AM  Result Value Ref Range   Glucose, Bld 124 (H) 65 - 99 mg/dL    Comment: .            Fasting reference interval . For someone without known diabetes, a glucose value between 100 and 125 mg/dL is consistent with prediabetes and should be confirmed with a follow-up test. .    BUN 11 7 - 25 mg/dL   Creat 0.95 0.60 - 1.35 mg/dL   GFR, Est Non African American 95 > OR = 60 mL/min/1.36m   GFR, Est African American 110 > OR = 60 mL/min/1.763m  BUN/Creatinine Ratio NOT APPLICABLE 6 - 22 (calc)   Sodium 139 135 - 146 mmol/L   Potassium 4.3 3.5 - 5.3 mmol/L   Chloride 105 98 - 110 mmol/L   CO2 30 20 - 32 mmol/L   Calcium 9.1 8.6 - 10.3 mg/dL   Total Protein 6.3 6.1 - 8.1 g/dL    Albumin 4.4 3.6 - 5.1 g/dL   Globulin 1.9 1.9 - 3.7 g/dL (calc)   AG Ratio 2.3 1.0 - 2.5 (calc)   Total Bilirubin 0.7 0.2 - 1.2 mg/dL   Alkaline phosphatase (APISO) 63 40 - 115 U/L   AST 17 10 - 40 U/L   ALT 21 9 - 46 U/L  Lipid panel     Status: Abnormal   Collection Time: 09/30/17  8:15 AM  Result Value Ref Range   Cholesterol 141 <200 mg/dL   HDL 53 >40 mg/dL   Triglycerides 154 (H) <150 mg/dL   LDL Cholesterol (Calc)  64 mg/dL (calc)    Comment: Reference range: <100 . Desirable range <100 mg/dL for primary prevention;   <70 mg/dL for patients with CHD or diabetic patients  with > or = 2 CHD risk factors. Marland Kitchen LDL-C is now calculated using the Martin-Hopkins  calculation, which is a validated novel method providing  better accuracy than the Friedewald equation in the  estimation of LDL-C.  Cresenciano Genre et al. Annamaria Helling. 1610;960(45): 2061-2068  (http://education.[facility].com/faq/FAQ164)    Total CHOL/HDL Ratio 2.7 <5.0 (calc)   Non-HDL Cholesterol (Calc) 88 <130 mg/dL (calc)    Comment: For patients with diabetes plus 1 major ASCVD risk  factor, treating to a non-HDL-C goal of <100 mg/dL  (LDL-C of <70 mg/dL) is considered a therapeutic  option.   Testosterone     Status: None   Collection Time: 09/30/17  8:15 AM  Result Value Ref Range   Testosterone 277 250 - 827 ng/dL  Hemoglobin A1c     Status: Abnormal   Collection Time: 09/30/17  8:15 AM  Result Value Ref Range   Hgb A1c MFr Bld 6.1 (H) <5.7 % of total Hgb    Comment: For someone without known diabetes, a hemoglobin  A1c value between 5.7% and 6.4% is consistent with prediabetes and should be confirmed with a  follow-up test. . For someone with known diabetes, a value <7% indicates that their diabetes is well controlled. A1c targets should be individualized based on duration of diabetes, age, comorbid conditions, and other considerations. . This assay result is consistent with an increased risk of  diabetes. . Currently, no consensus exists regarding use of hemoglobin A1c for diagnosis of diabetes for children. .    Mean Plasma Glucose 128 (calc)   eAG (mmol/L) 7.1 (calc)     ASSESSMENT/PLAN:   Controlled type 2 diabetes mellitus without complication, without long-term current use of insulin (HCC) - Plan: Hemoglobin A1c, metFORMIN (GLUCOPHAGE-XR) 500 MG 24 hr tablet, liraglutide (VICTOZA) 18 MG/3ML SOPN  Testosterone deficiency - Plan: testosterone cypionate (DEPOTESTOTERONE CYPIONATE) 100 MG/ML injection, NEEDLE, DISP, 18 G 18G X 1" MISC, NEEDLE, DISP, 22 G 22G X 1-1/2" MISC  Low testosterone - Plan: Testosterone  Major depressive disorder with single episode, in partial remission ([place]) - Plan: buPROPion (WELLBUTRIN XL) 300 MG 24 hr tablet  Acute non-recurrent sinusitis, unspecified location - Plan: amoxicillin-clavulanate (AUGMENTIN) 875-125 MG tablet      Visit summary with medication list and pertinent instructions was printed for patient to review. All questions at time of visit were answered - patient instructed to contact office with any additional concerns. ER/RTC precautions were reviewed with the patient.   Follow-up plan: Return in about 3 months (around 12/31/2017) for recheck sugars/testosterone .  Note: Total time spent 25 minutes, greater than 50% of the visit was spent face-to-face counseling and coordinating care for the following: The primary encounter diagnosis was Controlled type 2 diabetes mellitus without complication, without long-term current use of insulin ([place]). Diagnoses of Testosterone deficiency, Low testosterone, Major depressive disorder with single episode, in partial remission ([place]), and Acute non-recurrent sinusitis, unspecified location were also pertinent to this visit.Marland Kitchen  Please note: voice recognition software was used to produce this document, and typos may escape review. Please contact Dr. Sheppard Coil for any needed clarifications.

## 2017-10-17 ENCOUNTER — Telehealth: Payer: Self-pay | Admitting: Osteopathic Medicine

## 2017-10-17 NOTE — Telephone Encounter (Signed)
Forwarding to provider for review - pt was given antibiotics on 10/03/17 for acute non-recurrent sinusitis. Pls advise, thanks.

## 2017-10-17 NOTE — Telephone Encounter (Signed)
Patient is requesting a refill on his Augmentin that was prescribed earlier this month. He stated he was feeling better after taking it for a few days but he is starting to feel bad again. He [facility]. Please advise. Thank you!

## 2017-10-18 MED ORDER — DOXYCYCLINE HYCLATE 100 MG PO TABS: 100.0000 mg | ORAL_TABLET | Freq: Two times a day (BID) | ORAL | 0 refills | Status: DC

## 2017-10-18 NOTE — Telephone Encounter (Signed)
Sent alternative antibiotics rather than refill something that didn't treat completely - if this one doesn't work he will need CT scan of the sinuses

## 2017-10-18 NOTE — Telephone Encounter (Signed)
Attempted to contact Pt. No answer and VM is full.

## 2017-10-20 NOTE — Telephone Encounter (Signed)
Attempted to contact Pt for update, no answer and VM is full.

## 2017-11-16 ENCOUNTER — Other Ambulatory Visit: Payer: Self-pay | Admitting: Osteopathic Medicine

## 2017-11-17 ENCOUNTER — Telehealth: Payer: Self-pay | Admitting: Osteopathic Medicine

## 2017-11-17 NOTE — Telephone Encounter (Signed)
Received fax from [employer] that Novofine needles requires a PA. Information has been sent to the insurance company. Awaiting determination.

## 2017-11-20 ENCOUNTER — Other Ambulatory Visit: Payer: Self-pay | Admitting: Osteopathic Medicine

## 2017-11-20 DIAGNOSIS — E349 Endocrine disorder, unspecified: Secondary | ICD-10-CM

## 2017-11-23 ENCOUNTER — Other Ambulatory Visit: Payer: Self-pay

## 2017-11-23 MED ORDER — INSULIN PEN NEEDLE 32G X 4 MM MISC: 0 refills | Status: AC

## 2017-12-20 ENCOUNTER — Other Ambulatory Visit: Payer: Self-pay | Admitting: Osteopathic Medicine

## 2017-12-20 DIAGNOSIS — E349 Endocrine disorder, unspecified: Secondary | ICD-10-CM

## 2017-12-20 NOTE — Telephone Encounter (Signed)
[facility].

## 2017-12-20 NOTE — Telephone Encounter (Signed)
Left a detailed vm msg for pt regarding med refill sent to pharmacy. Call back information provided.

## 2018-02-07 ENCOUNTER — Other Ambulatory Visit: Payer: Self-pay | Admitting: Osteopathic Medicine

## 2018-02-07 DIAGNOSIS — R7989 Other specified abnormal findings of blood chemistry: Secondary | ICD-10-CM

## 2018-02-07 LAB — CBC
HCT: 46.9 % (ref 38.5–50.0)
Hemoglobin: 16.2 g/dL (ref 13.2–17.1)
MCH: 32.2 pg (ref 27.0–33.0)
MCHC: 34.5 g/dL (ref 32.0–36.0)
MCV: 93.2 fL (ref 80.0–100.0)
MPV: 11.2 fL (ref 7.5–12.5)
PLATELETS: 200 10*3/uL (ref 140–400)
RBC: 5.03 10*6/uL (ref 4.20–5.80)
RDW: 13 % (ref 11.0–15.0)
WBC: 5.9 10*3/uL (ref 3.8–10.8)

## 2018-02-07 LAB — TEST AUTHORIZATION 2

## 2018-02-07 LAB — HEMOGLOBIN A1C
Hgb A1c MFr Bld: 5.9 % of total Hgb — ABNORMAL HIGH (ref <5.7)
Mean Plasma Glucose: 123 (calc)
eAG (mmol/L): 6.8 (calc)

## 2018-02-07 LAB — TESTOSTERONE: Testosterone: 867 ng/dL — ABNORMAL HIGH (ref 250–827)

## 2018-02-07 NOTE — Progress Notes (Signed)
Cbc add on

## 2018-02-08 ENCOUNTER — Encounter: Payer: Self-pay | Admitting: Osteopathic Medicine

## 2018-02-08 ENCOUNTER — Ambulatory Visit (INDEPENDENT_AMBULATORY_CARE_PROVIDER_SITE_OTHER): Payer: BLUE CROSS/BLUE SHIELD | Admitting: Osteopathic Medicine

## 2018-02-08 VITALS — BP 119/76 | HR 76 | Temp 98.5°F | Wt 232.3 lb

## 2018-02-08 DIAGNOSIS — E119 Type 2 diabetes mellitus without complications: Secondary | ICD-10-CM | POA: Diagnosis not present

## 2018-02-08 DIAGNOSIS — E349 Endocrine disorder, unspecified: Secondary | ICD-10-CM

## 2018-02-08 DIAGNOSIS — F331 Major depressive disorder, recurrent, moderate: Secondary | ICD-10-CM

## 2018-02-08 DIAGNOSIS — R112 Nausea with vomiting, unspecified: Secondary | ICD-10-CM | POA: Diagnosis not present

## 2018-02-08 DIAGNOSIS — Z23 Encounter for immunization: Secondary | ICD-10-CM | POA: Diagnosis not present

## 2018-02-08 MED ORDER — ATORVASTATIN CALCIUM 40 MG PO TABS: 40.0000 mg | ORAL_TABLET | Freq: Every day | ORAL | 3 refills | Status: DC

## 2018-02-08 MED ORDER — ONDANSETRON 4 MG PO TBDP: 8.0000 mg | ORAL_TABLET | Freq: Three times a day (TID) | ORAL | 3 refills | Status: DC | PRN

## 2018-02-08 MED ORDER — VORTIOXETINE HBR 10 MG PO TABS: 10.0000 mg | ORAL_TABLET | Freq: Every day | ORAL | 1 refills | Status: DC

## 2018-02-08 NOTE — Telephone Encounter (Signed)
Covered for patient.

## 2018-02-08 NOTE — Progress Notes (Signed)
HPI: Roy Meyers is a 47 y.o. male who  has a past medical history of Diabetes mellitus without complication ([place]) and Hyperlipidemia.  he presents to [facility] today, 02/08/18,  for chief complaint of:  Follow up: DM2, hypogonadism, depression   Depression: worsening lately, poor experience with Wellbutrin and Lexapro. Is open to try Trintellix.  Occasional thoughts of death, passive death wish but no suicidal ideation.  Type 2 diabetes: Under really good control, some success with weight loss, tolerating medicines well.  Hypogonadism: Doing well with testosterone.  Recent levels were a little bit on the high side but he states that he had blood work done on Monday after taking an injection about 3 days prior to that.  New issue: Has had about 3 episodes of nausea with associated vomiting and diarrhea, one episode in the day, few hours after eating lunch, happens when he is noticing increased anxiety.  No history of irritable bowel syndrome, no bloody or bilious emesis, no coffee grounds emesis.  Stool is loose, happens one episode and then no additional problems throughout the day.      Past medical history, surgical history, and family history reviewed.  Current medication list and allergy/intolerance information reviewed.   (See remainder of HPI, ROS, Phys Exam below)  No results found.  Results for orders placed or performed in visit on 10/03/17 (from the past 72 hour(s))  Testosterone     Status: Abnormal   Collection Time: 02/06/18  7:35 AM  Result Value Ref Range   Testosterone 867 (H) 250 - 827 ng/dL  Hemoglobin A1c     Status: Abnormal   Collection Time: 02/06/18  7:35 AM  Result Value Ref Range   Hgb A1c MFr Bld 5.9 (H) <5.7 % of total Hgb    Comment: For someone without known diabetes, a hemoglobin  A1c value between 5.7% and 6.4% is consistent with prediabetes and should be confirmed with a  follow-up test. . For someone  with known diabetes, a value <7% indicates that their diabetes is well controlled. A1c targets should be individualized based on duration of diabetes, age, comorbid conditions, and other considerations. . This assay result is consistent with an increased risk of diabetes. . Currently, no consensus exists regarding use of hemoglobin A1c for diagnosis of diabetes for children. .    Mean Plasma Glucose 123 (calc)   eAG (mmol/L) 6.8 (calc)  CBC     Status: None   Collection Time: 02/06/18  7:35 AM  Result Value Ref Range   WBC 5.9 3.8 - 10.8 Thousand/uL   RBC 5.03 4.20 - 5.80 Million/uL   Hemoglobin 16.2 13.2 - 17.1 g/dL   HCT 46.9 38.5 - 50.0 %   MCV 93.2 80.0 - 100.0 fL   MCH 32.2 27.0 - 33.0 pg   MCHC 34.5 32.0 - 36.0 g/dL   RDW 13.0 11.0 - 15.0 %   Platelets 200 140 - 400 Thousand/uL   MPV 11.2 7.5 - 12.5 fL  TEST AUTHORIZATION 2     Status: None   Collection Time: 02/06/18  7:35 AM  Result Value Ref Range   TEST NAME: CBC (H/H, RBC, INDICES,    TEST CODE: [id number]    CLIENT CONTACT: VANICIA HERNANDEZ    REPORT ALWAYS MESSAGE SIGNATURE      Comment: . The laboratory testing on this patient was verbally requested or confirmed by the ordering physician or his or her authorized representative after contact with an employee  of [employer]. Federal regulations require that we maintain on file written authorization for all laboratory testing.  Accordingly we are asking that the ordering physician or his or her authorized representative sign a copy of this report and promptly return it to the client service representative. . . Signature:____________________________________________________ . Please fax this signed page to [phone] or return it via your [employer] courier.      ASSESSMENT/PLAN:   Controlled type 2 diabetes mellitus without complication, without long-term current use of insulin (HCC) - Doing well on current medications, will plan to  continue - Plan: atorvastatin (LIPITOR) 40 MG tablet  Moderate episode of recurrent major depressive disorder (HCC) - Plan: vortioxetine HBr (TRINTELLIX) 10 MG TABS tablet  Testosterone deficiency - Recent elevation likely due to having blood draw done sooner than midway between injections.  Will monitor.  Need for Tdap vaccination - Plan: Tdap vaccine greater than or equal to 7yo IM  Nausea and vomiting, intractability of vomiting not specified, unspecified vomiting type - Questionable anxiety reaction, IBS component, seems intermittent and not connected to other symptoms or causative factors.  Trial Zofran, monitor   Meds ordered this encounter  Medications  . ondansetron (ZOFRAN-ODT) 4 MG disintegrating tablet    Sig: Take 2 tablets (8 mg total) by mouth every 8 (eight) hours as needed for nausea or vomiting.    Dispense:  20 tablet    Refill:  3  . vortioxetine HBr (TRINTELLIX) 10 MG TABS tablet    Sig: Take 1 tablet (10 mg total) by mouth daily.    Dispense:  30 tablet    Refill:  1  . atorvastatin (LIPITOR) 40 MG tablet    Sig: Take 1 tablet (40 mg total) by mouth daily.    Dispense:  90 tablet    Refill:  3    Patient Instructions   Sugars, cholesterol look great!   Nausea/Vomiting and diarrhea - no red flags for major problems - let's see If we can improve anxiety, and try the nausea medicine in the meantime as needed   Will try Trintellix for depression issues - this may take a few days to get approved - let us know if there are any problems!    Follow-up plan: Return in about 1 month (around 03/08/2018) for recheck on Trintellix, sooner if needed .     ############################################ ############################################ ############################################ ############################################    Outpatient Encounter Medications as of 02/08/2018  Medication Sig  . atorvastatin (LIPITOR) 40 MG tablet Take 1 tablet (40 mg  total) by mouth daily.  . blood glucose meter kit and supplies KIT Dispense based on patient and insurance preference. Use as directed up to bid. Dx: E11.65  . buPROPion (WELLBUTRIN XL) 300 MG 24 hr tablet Take 1 tablet (300 mg total) by mouth daily.  Marland Kitchen doxycycline (VIBRA-TABS) 100 MG tablet Take 1 tablet (100 mg total) by mouth 2 (two) times daily.  . Insulin Pen Needle (NOVOFINE PLUS) 32G X 4 MM MISC USE AS DIRECTED  . metFORMIN (GLUCOPHAGE-XR) 500 MG 24 hr tablet Take 2 tablets (1,000 mg total) by mouth daily with breakfast.  . NEEDLE, DISP, 18 G 18G X 1" MISC Use with testosterone as directed to draw  . NEEDLE, DISP, 22 G 22G X 1-1/2" MISC Use with testosterone as directed to inject  . Syringe, Disposable, (SYRINGE LUER LOCK) 3 ML MISC Use with testosterone as directed  . testosterone cypionate (DEPOTESTOTERONE CYPIONATE) 100 MG/ML injection INJECT 1 ML INTO THE MUSCLE EVERY  7 DAYS. FOR IM USE ONLY  . liraglutide (VICTOZA) 18 MG/3ML SOPN Inject 0.3 mLs (1.8 mg total) into the skin daily. Please dispense necessary pens for this medicine - contact office if separate Rx needed   No facility-administered encounter medications on file as of 02/08/2018.    No Known Allergies    Review of Systems:  Constitutional: No recent illness  HEENT: No  headache, no vision change  Cardiac: No  chest pain, No  pressure, No palpitations  Respiratory:  No  shortness of breath. No  Cough  Gastrointestinal: No  abdominal pain, +change on bowel habits - see HPI  Musculoskeletal: No new myalgia/arthralgia  Skin: No  Rash  Hem/Onc: No  easy bruising/bleeding, No  abnormal lumps/bumps  Neurologic: No  weakness, No  Dizziness  Psychiatric: +concerns with depression, +concerns with anxiety  Exam:  BP 119/76 (BP Location: Left Arm, Patient Position: Sitting, Cuff Size: Normal)   Pulse 76   Temp 98.5 F (36.9 C) (Oral)   Wt 232 lb 4.8 oz (105.4 kg)   BMI 30.65 kg/m   Constitutional: VS see  above. General Appearance: alert, well-developed, well-nourished, NAD  Eyes: Normal lids and conjunctive, non-icteric sclera  Ears, Nose, Mouth, Throat: MMM, Normal external inspection ears/nares/mouth/lips/gums.  Neck: No masses, trachea midline.   Respiratory: Normal respiratory effort. no wheeze, no rhonchi, no rales  Cardiovascular: S1/S2 normal, no murmur, no rub/gallop auscultated. RRR.   Musculoskeletal: Gait normal. Symmetric and independent movement of all extremities  Neurological: Normal balance/coordination. No tremor.  Skin: warm, dry, intact.   Psychiatric: Normal judgment/insight. Normal mood and affect. Oriented x3.   Visit summary with medication list and pertinent instructions was printed for patient to review, advised to alert [place] if any changes needed. All questions at time of visit were answered - patient instructed to contact office with any additional concerns. ER/RTC precautions were reviewed with the patient and understanding verbalized.   Follow-up plan: Return in about 1 month (around 03/08/2018) for recheck on Trintellix, sooner if needed .  Note: Total time spent 25 minutes, greater than 50% of the visit was spent face-to-face counseling and coordinating care for the following: The primary encounter diagnosis was Controlled type 2 diabetes mellitus without complication, without long-term current use of insulin ([place]). Diagnoses of Moderate episode of recurrent major depressive disorder ([place]), Testosterone deficiency, Need for Tdap vaccination, and Nausea and vomiting, intractability of vomiting not specified, unspecified vomiting type were also pertinent to this visit.Marland Kitchen  Please note: voice recognition software was used to produce this document, and typos may escape review. Please contact Dr. Sheppard Coil for any needed clarifications.

## 2018-02-08 NOTE — Patient Instructions (Addendum)
   Sugars, cholesterol look great!   Nausea/Vomiting and diarrhea - no red flags for major problems - let's see If we can improve anxiety, and try the nausea medicine in the meantime as needed   Will try Trintellix for depression issues - this may take a few days to get approved - let us know if there are any problems!

## 2018-02-13 ENCOUNTER — Other Ambulatory Visit: Payer: Self-pay | Admitting: Osteopathic Medicine

## 2018-04-03 ENCOUNTER — Other Ambulatory Visit: Payer: Self-pay | Admitting: Osteopathic Medicine

## 2018-04-16 ENCOUNTER — Other Ambulatory Visit: Payer: Self-pay | Admitting: Osteopathic Medicine

## 2018-04-16 DIAGNOSIS — E349 Endocrine disorder, unspecified: Secondary | ICD-10-CM

## 2018-04-24 ENCOUNTER — Telehealth: Payer: Self-pay

## 2018-04-24 DIAGNOSIS — R7989 Other specified abnormal findings of blood chemistry: Secondary | ICD-10-CM

## 2018-04-24 DIAGNOSIS — E349 Endocrine disorder, unspecified: Secondary | ICD-10-CM

## 2018-04-24 MED ORDER — TESTOSTERONE CYPIONATE 100 MG/ML IM SOLN: 100.0000 mg | INTRAMUSCULAR | 0 refills | Status: DC

## 2018-04-24 NOTE — Telephone Encounter (Signed)
Pt called requesting med RF for test cyp. Dr. Georgina Snell placed a new rx on 04/18/18. It has instructions for pt to have lab work completed before getting next rx. No order has been placed. Pls place an order. Thanks.

## 2018-04-24 NOTE — Telephone Encounter (Signed)
Orders placed for future but he's ok to get the refill. Last levels were high because he got labs drawn too soon. OK to refill for 6 mos. He was supposed to come see me to follow up on mental health stuff - can we call to check on him? Thanks.

## 2018-04-25 ENCOUNTER — Other Ambulatory Visit: Payer: Self-pay | Admitting: Osteopathic Medicine

## 2018-04-25 MED ORDER — INSULIN PEN NEEDLE 32G X 4 MM MISC: 0 refills | Status: DC

## 2018-04-25 NOTE — Telephone Encounter (Signed)
Pt has been updated. As per pt, he will check with his work schedule to make his next appt with provider for [facility] check up.

## 2018-04-28 ENCOUNTER — Telehealth: Payer: Self-pay

## 2018-04-28 NOTE — Telephone Encounter (Signed)
As per pt, unable to get test cyp rx from pharmacy until the 05/11/18. Pt is currently out of med. Pt states that insurance will not cover rx because of the way rx was written. Pls advise, thanks.

## 2018-04-29 NOTE — Telephone Encounter (Signed)
Reviewed prescription.  There may be an issue with the quantity, if the pharmacy is not authorized to dispense 3 mL at a time?  Otherwise, I am not sure what the issue would be.  Will need to call the pharmacy to find out, and see if they can just send [place] electronic refill request directly

## 2018-05-02 NOTE — Telephone Encounter (Signed)
As per Sharee Pimple at [facility] - rx was being entered incorrectly into their system. Pharmacist has fixed the problem. Pt was informed that he can p/u his testosterone rx today.

## 2018-05-03 NOTE — Telephone Encounter (Signed)
Ok, thanks for looking into this!

## 2018-05-16 ENCOUNTER — Ambulatory Visit: Payer: BLUE CROSS/BLUE SHIELD | Admitting: Osteopathic Medicine

## 2018-05-16 ENCOUNTER — Encounter: Payer: Self-pay | Admitting: Osteopathic Medicine

## 2018-05-16 VITALS — BP 119/75 | HR 78 | Temp 98.3°F | Wt 230.0 lb

## 2018-05-16 DIAGNOSIS — E349 Endocrine disorder, unspecified: Secondary | ICD-10-CM

## 2018-05-16 DIAGNOSIS — R05 Cough: Secondary | ICD-10-CM

## 2018-05-16 DIAGNOSIS — E119 Type 2 diabetes mellitus without complications: Secondary | ICD-10-CM | POA: Diagnosis not present

## 2018-05-16 DIAGNOSIS — R059 Cough, unspecified: Secondary | ICD-10-CM

## 2018-05-16 LAB — CBC
HCT: 47.2 % (ref 38.5–50.0)
HEMOGLOBIN: 16.2 g/dL (ref 13.2–17.1)
MCH: 31.6 pg (ref 27.0–33.0)
MCHC: 34.3 g/dL (ref 32.0–36.0)
MCV: 92 fL (ref 80.0–100.0)
MPV: 10.9 fL (ref 7.5–12.5)
Platelets: 232 10*3/uL (ref 140–400)
RBC: 5.13 10*6/uL (ref 4.20–5.80)
RDW: 12.3 % (ref 11.0–15.0)
WBC: 7.7 10*3/uL (ref 3.8–10.8)

## 2018-05-16 LAB — POCT GLYCOSYLATED HEMOGLOBIN (HGB A1C): Hemoglobin A1C: 6.2 % — AB (ref 4.0–5.6)

## 2018-05-16 LAB — TESTOSTERONE: Testosterone: 266 ng/dL (ref 250–827)

## 2018-05-16 MED ORDER — TESTOSTERONE CYPIONATE 100 MG/ML IM SOLN: 150.0000 mg | INTRAMUSCULAR | 3 refills | Status: DC

## 2018-05-16 MED ORDER — BENZONATATE 200 MG PO CAPS: 200.0000 mg | ORAL_CAPSULE | Freq: Two times a day (BID) | ORAL | 0 refills | Status: DC | PRN

## 2018-05-16 NOTE — Patient Instructions (Addendum)
Call / message me in 1-2 weeks to update on cough, if no better will get Xray, but if worse or change, come see me! Cough meds are at pharmacy  Would consider Trintellix - have pharmacy run the savings card!   Increase testosterone to 150 mg (1.5 mL) per week and will recheck labs in a few weeks to make sure T levels and blood counts aren't too high

## 2018-05-16 NOTE — Progress Notes (Signed)
HPI: Roy Meyers is a 47 y.o. male who  has a past medical history of Diabetes mellitus without complication ([place]) and Hyperlipidemia.  he presents to [facility] today, 05/16/18,  for chief complaint of:  Follow up: DM2, hypogonadism, depression New: cough   Depression: worsening recently, poor experience with Wellbutrin and Lexapro. Occasional thoughts of death, passive death wish but no suicidal ideation. We startedTrintellix at previous visit.   Type 2 diabetes: Under really good control, good success with weight loss, tolerating medicines well. A1C 6.2 today   Hypogonadism: Doing well with testosterone.  Previous levels were a little bit on the high side but he states that he had blood work done on Monday after taking an injection about 3 days prior to that. Recent levels are low, drawn on 05/15/18, last injection 4 days prior to blood draw.   Reports fever awhile back w/ URI symptoms and cough x1.5 week, mild, mostly in evenings. No fever now, no SB.     Wt Readings from Last 3 Encounters:  05/16/18 230 lb (104.3 kg)  02/08/18 232 lb 4.8 oz (105.4 kg)  10/03/17 229 lb 1.3 oz (103.9 kg)      Past medical history, surgical history, and family history reviewed.  Current medication list and allergy/intolerance information reviewed.   (See remainder of HPI, ROS, Phys Exam below)  No results found.  Results for orders placed or performed in visit on 04/24/18 (from the past 72 hour(s))  CBC     Status: None   Collection Time: 05/15/18 11:32 AM  Result Value Ref Range   WBC 7.7 3.8 - 10.8 Thousand/uL   RBC 5.13 4.20 - 5.80 Million/uL   Hemoglobin 16.2 13.2 - 17.1 g/dL   HCT 47.2 38.5 - 50.0 %   MCV 92.0 80.0 - 100.0 fL   MCH 31.6 27.0 - 33.0 pg   MCHC 34.3 32.0 - 36.0 g/dL   RDW 12.3 11.0 - 15.0 %   Platelets 232 140 - 400 Thousand/uL   MPV 10.9 7.5 - 12.5 fL  Testosterone     Status: None   Collection Time: 05/15/18 11:32 AM   Result Value Ref Range   Testosterone 266 250 - 827 ng/dL     ASSESSMENT/PLAN:   Controlled type 2 diabetes mellitus without complication, without long-term current use of insulin (HCC) - Plan: POCT HgB A1C  Testosterone deficiency - Plan: testosterone cypionate (DEPOTESTOTERONE CYPIONATE) 100 MG/ML injection, Testosterone, CBC  Cough   Meds ordered this encounter  Medications  . testosterone cypionate (DEPOTESTOTERONE CYPIONATE) 100 MG/ML injection    Sig: Inject 1.5 mLs (150 mg total) into the muscle every 7 (seven) days.    Dispense:  10 mL    Refill:  3    Not to exceed 5 additional fills before 06/18/2018.  . benzonatate (TESSALON) 200 MG capsule    Sig: Take 1 capsule (200 mg total) by mouth 2 (two) times daily as needed for cough.    Dispense:  20 capsule    Refill:  0    Patient Instructions  Call / message me in 1-2 weeks to update on cough, if no better will get Xray, but if worse or change, come see me! Cough meds are at pharmacy  Would consider Trintellix - have pharmacy run the savings card!   Increase testosterone to 150 mg (1.5 mL) per week and will recheck labs in a few weeks to make sure T levels and blood counts aren't too  high    Follow-up plan: Return for LAB VISIT ONLY check testosterone and blood counts next few weeks .Plan to recheck based on results             ############################################ ############################################ ############################################ ############################################    Outpatient Encounter Medications as of 05/16/2018  Medication Sig  . atorvastatin (LIPITOR) 40 MG tablet Take 1 tablet (40 mg total) by mouth daily.  . blood glucose meter kit and supplies KIT Dispense based on patient and insurance preference. Use as directed up to bid. Dx: E11.65  . buPROPion (WELLBUTRIN XL) 300 MG 24 hr tablet Take 1 tablet (300 mg total) by mouth daily.  Marland Kitchen doxycycline  (VIBRA-TABS) 100 MG tablet Take 1 tablet (100 mg total) by mouth 2 (two) times daily.  . Insulin Pen Needle (BD PEN NEEDLE NANO U/F) 32G X 4 MM MISC USE UPTO 4 TIMES A DAY  . Insulin Pen Needle (NOVOFINE PLUS) 32G X 4 MM MISC USE AS DIRECTED  . metFORMIN (GLUCOPHAGE-XR) 500 MG 24 hr tablet Take 2 tablets (1,000 mg total) by mouth daily with breakfast.  . NEEDLE, DISP, 18 G 18G X 1" MISC Use with testosterone as directed to draw  . NEEDLE, DISP, 22 G 22G X 1-1/2" MISC Use with testosterone as directed to inject  . ondansetron (ZOFRAN-ODT) 4 MG disintegrating tablet Take 2 tablets (8 mg total) by mouth every 8 (eight) hours as needed for nausea or vomiting.  . Syringe, Disposable, (SYRINGE LUER LOCK) 3 ML MISC Use with testosterone as directed  . testosterone cypionate (DEPOTESTOTERONE CYPIONATE) 100 MG/ML injection Inject 1 mL (100 mg total) into the muscle every 7 (seven) days.  Marland Kitchen vortioxetine HBr (TRINTELLIX) 10 MG TABS tablet Take 1 tablet (10 mg total) by mouth daily.  Marland Kitchen liraglutide (VICTOZA) 18 MG/3ML SOPN Inject 0.3 mLs (1.8 mg total) into the skin daily. Please dispense necessary pens for this medicine - contact office if separate Rx needed   No facility-administered encounter medications on file as of 05/16/2018.    No Known Allergies    Review of Systems:  Constitutional: No recent illness  HEENT: No  headache, no vision change  Cardiac: No  chest pain, No  pressure, No palpitations  Respiratory:  No  shortness of breath. No  Cough  Gastrointestinal: No  abdominal pain, +change on bowel habits - see HPI  Musculoskeletal: No new myalgia/arthralgia  Skin: No  Rash  Hem/Onc: No  easy bruising/bleeding, No  abnormal lumps/bumps  Neurologic: No  weakness, No  Dizziness  Psychiatric: +concerns with depression, +concerns with anxiety  Exam:  BP 119/75 (BP Location: Left Arm, Patient Position: Sitting, Cuff Size: Normal)   Pulse 78   Temp 98.3 F (36.8 C) (Oral)   Wt 230 lb  (104.3 kg)   BMI 30.34 kg/m   Constitutional: VS see above. General Appearance: alert, well-developed, well-nourished, NAD  Eyes: Normal lids and conjunctive, non-icteric sclera  Ears, Nose, Mouth, Throat: MMM, Normal external inspection ears/nares/mouth/lips/gums. TM WNL bl  Neck: No masses, trachea midline. No lymphadenopathy  Respiratory: Normal respiratory effort. no wheeze, no rhonchi, no rales  Cardiovascular: S1/S2 normal, no murmur, no rub/gallop auscultated. RRR.   Musculoskeletal: Gait normal. Symmetric and independent movement of all extremities  Neurological: Normal balance/coordination. No tremor.  Skin: warm, dry, intact.   Psychiatric: Normal judgment/insight. Normal mood and affect. Oriented x3.     Visit summary with medication list and pertinent instructions was printed for patient to review, advised to alert [place] if  any changes needed. All questions at time of visit were answered - patient instructed to contact office with any additional concerns. ER/RTC precautions were reviewed with the patient and understanding verbalized.   Follow-up plan: Return for LAB VISIT ONLY check testosterone and blood counts next few weeks . Marland KitchenPlan to recheck based on results   Note: Total time spent 25 minutes, greater than 50% of the visit was spent face-to-face counseling and coordinating care for the following: The primary encounter diagnosis was Controlled type 2 diabetes mellitus without complication, without long-term current use of insulin ([place]). Diagnoses of Testosterone deficiency and Cough were also pertinent to this visit.Marland Kitchen  Please note: voice recognition software was used to produce this document, and typos may escape review. Please contact Dr. Sheppard Coil for any needed clarifications.

## 2018-06-12 ENCOUNTER — Other Ambulatory Visit: Payer: Self-pay | Admitting: Family Medicine

## 2018-06-12 DIAGNOSIS — E349 Endocrine disorder, unspecified: Secondary | ICD-10-CM

## 2018-08-31 ENCOUNTER — Telehealth: Payer: Self-pay

## 2018-08-31 NOTE — Telephone Encounter (Signed)
Pt has been updated. Agrees with provider's recommendation, pt will utilize UC/e-Visit options. Unable to make an appt w/provider due to work travel until Saturday.

## 2018-08-31 NOTE — Telephone Encounter (Signed)
Agree, no antibiotics without office visit to ensure correct diagnosis and discuss treatment options. Recommend urgent care visit if needed, e-visit if he has access to this service through his insurance.

## 2018-08-31 NOTE — Telephone Encounter (Signed)
Noted  

## 2018-08-31 NOTE — Telephone Encounter (Signed)
Pt left a vm msg stating he has a bad sinus infection. Was recently traveling. Wants to know if provider can send in a rx to local pharmacy. I tried contacting pt to informed them that office visit needed. No answer, unable to leave vm msg. Vm box is full.

## 2018-10-03 ENCOUNTER — Ambulatory Visit: Payer: BLUE CROSS/BLUE SHIELD | Admitting: Osteopathic Medicine

## 2018-10-03 ENCOUNTER — Encounter: Payer: Self-pay | Admitting: Osteopathic Medicine

## 2018-10-03 VITALS — BP 135/78 | HR 77 | Temp 97.8°F | Wt 240.9 lb

## 2018-10-03 DIAGNOSIS — E1169 Type 2 diabetes mellitus with other specified complication: Secondary | ICD-10-CM

## 2018-10-03 DIAGNOSIS — F324 Major depressive disorder, single episode, in partial remission: Secondary | ICD-10-CM

## 2018-10-03 DIAGNOSIS — E119 Type 2 diabetes mellitus without complications: Secondary | ICD-10-CM

## 2018-10-03 DIAGNOSIS — E349 Endocrine disorder, unspecified: Secondary | ICD-10-CM | POA: Diagnosis not present

## 2018-10-03 DIAGNOSIS — Z23 Encounter for immunization: Secondary | ICD-10-CM | POA: Diagnosis not present

## 2018-10-03 DIAGNOSIS — E785 Hyperlipidemia, unspecified: Secondary | ICD-10-CM

## 2018-10-03 LAB — POCT GLYCOSYLATED HEMOGLOBIN (HGB A1C): HEMOGLOBIN A1C: 7.2 % — AB (ref 4.0–5.6)

## 2018-10-03 LAB — CBC
HCT: 47.1 % (ref 38.5–50.0)
HEMOGLOBIN: 16 g/dL (ref 13.2–17.1)
MCH: 31.5 pg (ref 27.0–33.0)
MCHC: 34 g/dL (ref 32.0–36.0)
MCV: 92.7 fL (ref 80.0–100.0)
MPV: 11.5 fL (ref 7.5–12.5)
Platelets: 206 10*3/uL (ref 140–400)
RBC: 5.08 10*6/uL (ref 4.20–5.80)
RDW: 12.3 % (ref 11.0–15.0)
WBC: 5.1 10*3/uL (ref 3.8–10.8)

## 2018-10-03 LAB — TESTOSTERONE: TESTOSTERONE: 547 ng/dL (ref 250–827)

## 2018-10-03 MED ORDER — SEMAGLUTIDE(0.25 OR 0.5MG/DOS) 2 MG/1.5ML ~~LOC~~ SOPN: 0.5000 mg | PEN_INJECTOR | SUBCUTANEOUS | 5 refills | Status: DC

## 2018-10-03 MED ORDER — TESTOSTERONE CYPIONATE 100 MG/ML IM SOLN
100.0000 mg | INTRAMUSCULAR | Status: DC
  Administered 2018-10-03: 100 mg via INTRAMUSCULAR

## 2018-10-03 NOTE — Patient Instructions (Signed)
Victoza is probably being phased out in favor of Ozempic - similar medicine but once weekly injections. Will send this Rx, if still expensive please call your insurance company and ask what is on formulary for diabetes treatment and have them fax [place] this information at [phone]

## 2018-10-03 NOTE — Progress Notes (Signed)
HPI: Roy Meyers is a 48 y.o. male who  has a past medical history of Diabetes mellitus without complication ([place]) and Hyperlipidemia.  he presents to [facility] today, 10/03/18,  for chief complaint of:  DM2 Testosterone  Type 2 diabetes: Under good control, was having good success with weight loss, but back up a bit today. A1C was 6.2 last visit, today is 7.2   Depression: worsening recently, poor experience with Wellbutrin and Lexapro. Occasional thoughts of death, passive death wish but no suicidal ideation. We started Trintellix at previous visit but he hadn't started this d/t cost. Savings card was provided again at last visit. He hasn't started this but is doing well on Wellbutrin    Hypogonadism: Doing well with testosterone. We had discussed increase to 1.5 mL (150 mg) per week, he has been taking this dose. Recent labs WNL for T and CBC.     Wt Readings from Last 3 Encounters:  10/03/18 240 lb 14.4 oz (109.3 kg)  05/16/18 230 lb (104.3 kg)  02/08/18 232 lb 4.8 oz (105.4 kg)       At today's visit 10/03/18 ... PMH, PSH, FH reviewed and updated as needed.  Current medication list and allergy/intolerance hx reviewed and updated as needed. (See remainder of HPI, ROS, Phys Exam below)   No results found.  Results for orders placed or performed in visit on 05/16/18 (from the past 72 hour(s))  Testosterone     Status: None   Collection Time: 10/02/18 12:00 AM  Result Value Ref Range   Testosterone 547 250 - 827 ng/dL  CBC     Status: None   Collection Time: 10/02/18 12:00 AM  Result Value Ref Range   WBC 5.1 3.8 - 10.8 Thousand/uL   RBC 5.08 4.20 - 5.80 Million/uL   Hemoglobin 16.0 13.2 - 17.1 g/dL   HCT 47.1 38.5 - 50.0 %   MCV 92.7 80.0 - 100.0 fL   MCH 31.5 27.0 - 33.0 pg   MCHC 34.0 32.0 - 36.0 g/dL   RDW 12.3 11.0 - 15.0 %   Platelets 206 140 - 400 Thousand/uL   MPV 11.5 7.5 - 12.5 fL           ASSESSMENT/PLAN: The primary encounter diagnosis was Controlled type 2 diabetes mellitus without complication, without long-term current use of insulin ([place]). Diagnoses of Testosterone deficiency, Major depressive disorder with single episode, in partial remission ([place]), and Hyperlipidemia associated with type 2 diabetes mellitus ([place]) were also pertinent to this visit.     Meds ordered this encounter  Medications  . Semaglutide,0.25 or 0.5MG/DOS, (OZEMPIC, 0.25 OR 0.5 MG/DOSE,) 2 MG/1.5ML SOPN    Sig: Inject 0.5 mg into the skin once a week.    Dispense:  4 pen    Refill:  5    CANCEL Victoza, if Ozempic too expensive, please suggest covered formulary alternative, thanks    Patient Instructions  Victoza is probably being phased out in favor of Ozempic - similar medicine but once weekly injections. Will send this Rx, if still expensive please call your insurance company and ask what is on formulary for diabetes treatment and have them fax [place] this information at [phone]       Follow-up plan: Return in about 3 months (around 01/01/2019) for A1C RECHECK / DIABETES MONITORING - sooner if needed .                                                 ################################################# ################################################# ################################################# #################################################  Current Meds  Medication Sig  . atorvastatin (LIPITOR) 40 MG tablet Take 1 tablet (40 mg total) by mouth daily.  . blood glucose meter kit and supplies KIT Dispense based on patient and insurance preference. Use as directed up to bid. Dx: E11.65  . buPROPion (WELLBUTRIN XL) 300 MG 24 hr tablet Take 1 tablet (300 mg total) by mouth daily.  . Insulin Pen Needle (BD PEN NEEDLE NANO U/F) 32G X 4 MM MISC USE UPTO 4 TIMES A DAY  . Insulin Pen Needle (NOVOFINE PLUS) 32G X 4 MM MISC USE  AS DIRECTED  . metFORMIN (GLUCOPHAGE-XR) 500 MG 24 hr tablet Take 2 tablets (1,000 mg total) by mouth daily with breakfast.  . NEEDLE, DISP, 18 G 18G X 1" MISC Use with testosterone as directed to draw  . NEEDLE, DISP, 22 G 22G X 1-1/2" MISC Use with testosterone as directed to inject  . ondansetron (ZOFRAN-ODT) 4 MG disintegrating tablet Take 2 tablets (8 mg total) by mouth every 8 (eight) hours as needed for nausea or vomiting.  . Syringe, Disposable, (SYRINGE LUER LOCK) 3 ML MISC Use with testosterone as directed  . testosterone cypionate (DEPOTESTOTERONE CYPIONATE) 100 MG/ML injection Inject 1.5 mLs (150 mg total) into the muscle every 7 (seven) days.  . [DISCONTINUED] benzonatate (TESSALON) 200 MG capsule Take 1 capsule (200 mg total) by mouth 2 (two) times daily as needed for cough.  . [DISCONTINUED] vortioxetine HBr (TRINTELLIX) 10 MG TABS tablet Take 1 tablet (10 mg total) by mouth daily.    No Known Allergies     Review of Systems:  Constitutional: No recent illness  HEENT: No  headache, no vision change  Cardiac: No  chest pain, No  pressure, No palpitations  Respiratory:  No  shortness of breath. No  Cough  Gastrointestinal: No  abdominal pain, no change on bowel habits  Musculoskeletal: No new myalgia/arthralgia  Skin: No  Rash  Hem/Onc: No  easy bruising/bleeding, No  abnormal lumps/bumps  Neurologic: No  weakness, No  Dizziness  Psychiatric: No  concerns with depression, No  concerns with anxiety  Exam:  BP 135/78 (BP Location: Left Arm, Patient Position: Sitting, Cuff Size: Normal)   Pulse 77   Temp 97.8 F (36.6 C) (Oral)   Wt 240 lb 14.4 oz (109.3 kg)   BMI 31.78 kg/m   Constitutional: VS see above. General Appearance: alert, well-developed, well-nourished, NAD  Eyes: Normal lids and conjunctive, non-icteric sclera  Ears, Nose, Mouth, Throat: MMM, Normal external inspection ears/nares/mouth/lips/gums.  Neck: No masses, trachea midline.    Respiratory: Normal respiratory effort. no wheeze, no rhonchi, no rales  Cardiovascular: S1/S2 normal, no murmur, no rub/gallop auscultated. RRR.   Musculoskeletal: Gait normal. Symmetric and independent movement of all extremities  Neurological: Normal balance/coordination. No tremor.  Skin: warm, dry, intact.   Psychiatric: Normal judgment/insight. Normal mood and affect. Oriented x3.       Visit summary with medication list and pertinent instructions was printed for patient to review, patient was advised to alert [place] if any updates are needed. All questions at time of visit were answered - patient instructed to contact office with any additional concerns. ER/RTC precautions were reviewed with the patient and understanding verbalized.    Please note: voice recognition software was used to produce this document, and typos may escape review. Please contact Dr. Sheppard Coil for any needed clarifications.    Follow up plan: Return in about 3 months (around 01/01/2019) for A1C RECHECK / DIABETES MONITORING -  sooner if needed .

## 2018-10-03 NOTE — Addendum Note (Signed)
Addended by: Mertha Finders on: 10/03/2018 08:49 AM   Modules accepted: Orders

## 2018-10-16 ENCOUNTER — Other Ambulatory Visit: Payer: Self-pay

## 2018-10-16 MED ORDER — INSULIN PEN NEEDLE 32G X 4 MM MISC: 0 refills | Status: AC

## 2018-12-14 ENCOUNTER — Other Ambulatory Visit: Payer: Self-pay | Admitting: Osteopathic Medicine

## 2018-12-14 DIAGNOSIS — E119 Type 2 diabetes mellitus without complications: Secondary | ICD-10-CM

## 2019-01-09 ENCOUNTER — Other Ambulatory Visit: Payer: Self-pay | Admitting: Osteopathic Medicine

## 2019-01-09 ENCOUNTER — Telehealth: Payer: Self-pay

## 2019-01-09 DIAGNOSIS — E349 Endocrine disorder, unspecified: Secondary | ICD-10-CM

## 2019-01-09 DIAGNOSIS — E119 Type 2 diabetes mellitus without complications: Secondary | ICD-10-CM

## 2019-01-09 NOTE — Telephone Encounter (Signed)
Pt called requesting med refill for testosterone cyp. Pls send to [facility].

## 2019-01-10 MED ORDER — TESTOSTERONE CYPIONATE 100 MG/ML IM SOLN: 150.0000 mg | INTRAMUSCULAR | 3 refills | Status: DC

## 2019-01-10 NOTE — Telephone Encounter (Signed)
Sent!

## 2019-01-10 NOTE — Telephone Encounter (Signed)
Left a detailed vm msg for pt regarding med refill sent to local pharmacy. Direct call back info provided.

## 2019-01-23 ENCOUNTER — Other Ambulatory Visit: Payer: Self-pay | Admitting: Osteopathic Medicine

## 2019-01-23 DIAGNOSIS — E119 Type 2 diabetes mellitus without complications: Secondary | ICD-10-CM

## 2019-01-24 ENCOUNTER — Other Ambulatory Visit: Payer: Self-pay | Admitting: Osteopathic Medicine

## 2019-01-24 DIAGNOSIS — E119 Type 2 diabetes mellitus without complications: Secondary | ICD-10-CM

## 2019-01-29 ENCOUNTER — Other Ambulatory Visit: Payer: Self-pay | Admitting: Osteopathic Medicine

## 2019-01-29 DIAGNOSIS — E119 Type 2 diabetes mellitus without complications: Secondary | ICD-10-CM

## 2019-02-02 ENCOUNTER — Telehealth: Payer: Self-pay | Admitting: Osteopathic Medicine

## 2019-02-02 DIAGNOSIS — E119 Type 2 diabetes mellitus without complications: Secondary | ICD-10-CM

## 2019-02-02 NOTE — Telephone Encounter (Signed)
[facility] sent [facility] request for a new Rx for Atorvastatin 40 mg  for this patient. His last lipid was 09/2017. Ok to send in a new prescription? Please advise.

## 2019-02-05 MED ORDER — ATORVASTATIN CALCIUM 40 MG PO TABS: 40.0000 mg | ORAL_TABLET | Freq: Every day | ORAL | 3 refills | Status: DC

## 2019-03-09 ENCOUNTER — Other Ambulatory Visit: Payer: Self-pay | Admitting: Osteopathic Medicine

## 2019-03-09 DIAGNOSIS — E119 Type 2 diabetes mellitus without complications: Secondary | ICD-10-CM

## 2019-03-09 NOTE — Telephone Encounter (Signed)
Please advise 

## 2019-03-22 ENCOUNTER — Telehealth: Payer: Self-pay

## 2019-03-22 DIAGNOSIS — E119 Type 2 diabetes mellitus without complications: Secondary | ICD-10-CM

## 2019-03-22 DIAGNOSIS — E785 Hyperlipidemia, unspecified: Secondary | ICD-10-CM

## 2019-03-22 DIAGNOSIS — E1169 Type 2 diabetes mellitus with other specified complication: Secondary | ICD-10-CM

## 2019-03-22 DIAGNOSIS — E349 Endocrine disorder, unspecified: Secondary | ICD-10-CM

## 2019-03-22 DIAGNOSIS — Z0000 Encounter for general adult medical examination without abnormal findings: Secondary | ICD-10-CM

## 2019-03-22 NOTE — Telephone Encounter (Signed)
Would add that if he is reluctant to come into the office due to [place], we can schedule a virtual visit but I will probably have him get blood work done first if he is open to doing that.  Okay to send 30-day supply of medications but he needs to see me in that time

## 2019-03-22 NOTE — Telephone Encounter (Signed)
Metformin rx was sent on 03/09/19. No refill required at this time.

## 2019-03-22 NOTE — Telephone Encounter (Signed)
Pt called regarding refills for metformin. Pt is overdue for a DM check with provider. Pls contact pt to schedule an appt. Thanks.   Routing to PCP as FYI.

## 2019-03-26 NOTE — Telephone Encounter (Signed)
Patient would like to get his testosterone and diabetes tested. Patient is coming to get labs on 04/02/2019 so he can come do his follow up appointment on 04/04/2019. Please place orders. No further questions at this time.

## 2019-03-28 NOTE — Telephone Encounter (Signed)
VM full

## 2019-03-28 NOTE — Telephone Encounter (Signed)
Orders are in, please remind patient that he needs to get testosterone levels drawn midway between his injections, and needs to be done early morning preferably before 10:00 AM, fasting.

## 2019-03-28 NOTE — Addendum Note (Signed)
Addended by: Maryla Morrow on: 03/28/2019 12:59 PM   Modules accepted: Orders

## 2019-04-03 LAB — LIPID PANEL
Cholesterol: 137 mg/dL (ref <200)
HDL: 47 mg/dL (ref >=40)
LDL Cholesterol (Calc): 61 mg/dL (calc)
Non-HDL Cholesterol (Calc): 90 mg/dL (calc) (ref <130)
Total CHOL/HDL Ratio: 2.9 (calc) (ref <5.0)
Triglycerides: 218 mg/dL — ABNORMAL HIGH (ref <150)

## 2019-04-03 LAB — COMPLETE METABOLIC PANEL WITH GFR
AG Ratio: 2.4 (calc) (ref 1.0–2.5)
ALT: 19 U/L (ref 9–46)
AST: 16 U/L (ref 10–40)
Albumin: 4.5 g/dL (ref 3.6–5.1)
Alkaline phosphatase (APISO): 44 U/L (ref 36–130)
BUN: 12 mg/dL (ref 7–25)
CO2: 27 mmol/L (ref 20–32)
Calcium: 9.3 mg/dL (ref 8.6–10.3)
Chloride: 104 mmol/L (ref 98–110)
Creat: 1.07 mg/dL (ref 0.60–1.35)
GFR, Est African American: 94 mL/min/{1.73_m2} (ref >=60)
GFR, Est Non African American: 81 mL/min/{1.73_m2} (ref >=60)
Globulin: 1.9 g/dL (calc) (ref 1.9–3.7)
Glucose, Bld: 142 mg/dL — ABNORMAL HIGH (ref 65–99)
Potassium: 4.5 mmol/L (ref 3.5–5.3)
Sodium: 139 mmol/L (ref 135–146)
Total Bilirubin: 0.9 mg/dL (ref 0.2–1.2)
Total Protein: 6.4 g/dL (ref 6.1–8.1)

## 2019-04-03 LAB — HEMOGLOBIN A1C
Hgb A1c MFr Bld: 6.6 % of total Hgb — ABNORMAL HIGH (ref <5.7)
Mean Plasma Glucose: 143 (calc)
eAG (mmol/L): 7.9 (calc)

## 2019-04-03 LAB — CBC
HCT: 48.8 % (ref 38.5–50.0)
Hemoglobin: 16.8 g/dL (ref 13.2–17.1)
MCH: 32.3 pg (ref 27.0–33.0)
MCHC: 34.4 g/dL (ref 32.0–36.0)
MCV: 93.8 fL (ref 80.0–100.0)
MPV: 11.3 fL (ref 7.5–12.5)
Platelets: 197 10*3/uL (ref 140–400)
RBC: 5.2 10*6/uL (ref 4.20–5.80)
RDW: 12.6 % (ref 11.0–15.0)
WBC: 6.5 10*3/uL (ref 3.8–10.8)

## 2019-04-03 LAB — TESTOSTERONE: Testosterone: 960 ng/dL — ABNORMAL HIGH (ref 250–827)

## 2019-04-03 LAB — TSH: TSH: 1.39 mIU/L (ref 0.40–4.50)

## 2019-04-04 ENCOUNTER — Encounter: Payer: Self-pay | Admitting: Osteopathic Medicine

## 2019-04-04 ENCOUNTER — Telehealth: Payer: Self-pay | Admitting: Osteopathic Medicine

## 2019-04-04 ENCOUNTER — Ambulatory Visit (INDEPENDENT_AMBULATORY_CARE_PROVIDER_SITE_OTHER): Payer: BC Managed Care – PPO | Admitting: Osteopathic Medicine

## 2019-04-04 DIAGNOSIS — E119 Type 2 diabetes mellitus without complications: Secondary | ICD-10-CM

## 2019-04-04 DIAGNOSIS — K219 Gastro-esophageal reflux disease without esophagitis: Secondary | ICD-10-CM | POA: Insufficient documentation

## 2019-04-04 DIAGNOSIS — F324 Major depressive disorder, single episode, in partial remission: Secondary | ICD-10-CM | POA: Diagnosis not present

## 2019-04-04 DIAGNOSIS — E349 Endocrine disorder, unspecified: Secondary | ICD-10-CM | POA: Diagnosis not present

## 2019-04-04 DIAGNOSIS — E1169 Type 2 diabetes mellitus with other specified complication: Secondary | ICD-10-CM

## 2019-04-04 DIAGNOSIS — R7989 Other specified abnormal findings of blood chemistry: Secondary | ICD-10-CM

## 2019-04-04 DIAGNOSIS — G4709 Other insomnia: Secondary | ICD-10-CM

## 2019-04-04 DIAGNOSIS — E785 Hyperlipidemia, unspecified: Secondary | ICD-10-CM

## 2019-04-04 MED ORDER — TRAZODONE HCL 50 MG PO TABS: 25.0000 mg | ORAL_TABLET | Freq: Every evening | ORAL | 0 refills | Status: DC | PRN

## 2019-04-04 MED ORDER — OMEPRAZOLE 40 MG PO CPDR: 40.0000 mg | DELAYED_RELEASE_CAPSULE | Freq: Every day | ORAL | 0 refills | Status: DC

## 2019-04-04 NOTE — Progress Notes (Signed)
Virtual Visit via Video (App used: Doximity) Note  I connected with      Roy Meyers on 04/04/19 at 8:10 AM by a telemedicine application and verified that I am speaking with the correct person using two identifiers.  Patient is at work I am in office   I discussed the limitations of evaluation and management by telemedicine and the availability of in person appointments. The patient expressed understanding and agreed to proceed.  History of Present Illness: Roy Meyers is a 48 y.o. male who would like to discuss sleep, acid reflux, checkup on DM2, anxiety   Sleep: Is getting to sleep okay but is routinely waking up in the middle of the night on most nights with concerns about anxiety/stress.  Heartburn: Noticing more acid reflux, especially in the evenings.  Testosterone, has been taking 2 mL weekly, levels are a bit on the high side.  Diabetes: A1c is at goal, patient inquires about possibly coming off of medications.  Anxiety: Patient has stopped the Wellbutrin, did not really seem like it was helping.  He is not interested in getting back on any other medications at this time.  Increased stress at work/home, particularly in the midst of pandemic.    Observations/Objective: There were no vitals taken for this visit. BP Readings from Last 3 Encounters:  10/03/18 135/78  05/16/18 119/75  02/08/18 119/76   Exam: Normal Speech.  NAD  Lab and Radiology Results Results for orders placed or performed in visit on 03/22/19 (from the past 72 hour(s))  CBC     Status: None   Collection Time: 04/02/19  9:26 AM  Result Value Ref Range   WBC 6.5 3.8 - 10.8 Thousand/uL   RBC 5.20 4.20 - 5.80 Million/uL   Hemoglobin 16.8 13.2 - 17.1 g/dL   HCT 48.8 38.5 - 50.0 %   MCV 93.8 80.0 - 100.0 fL   MCH 32.3 27.0 - 33.0 pg   MCHC 34.4 32.0 - 36.0 g/dL   RDW 12.6 11.0 - 15.0 %   Platelets 197 140 - 400 Thousand/uL   MPV 11.3 7.5 - 12.5 fL  COMPLETE METABOLIC PANEL WITH GFR      Status: Abnormal   Collection Time: 04/02/19  9:26 AM  Result Value Ref Range   Glucose, Bld 142 (H) 65 - 99 mg/dL    Comment: .            Fasting reference interval . For someone without known diabetes, a glucose value >125 mg/dL indicates that they may have diabetes and this should be confirmed with a follow-up test. .    BUN 12 7 - 25 mg/dL   Creat 1.07 0.60 - 1.35 mg/dL   GFR, Est Non African American 81 > OR = 60 mL/min/1.33m   GFR, Est African American 94 > OR = 60 mL/min/1.726m  BUN/Creatinine Ratio NOT APPLICABLE 6 - 22 (calc)   Sodium 139 135 - 146 mmol/L   Potassium 4.5 3.5 - 5.3 mmol/L   Chloride 104 98 - 110 mmol/L   CO2 27 20 - 32 mmol/L   Calcium 9.3 8.6 - 10.3 mg/dL   Total Protein 6.4 6.1 - 8.1 g/dL   Albumin 4.5 3.6 - 5.1 g/dL   Globulin 1.9 1.9 - 3.7 g/dL (calc)   AG Ratio 2.4 1.0 - 2.5 (calc)   Total Bilirubin 0.9 0.2 - 1.2 mg/dL   Alkaline phosphatase (APISO) 44 36 - 130 U/L   AST 16 10 -  40 U/L   ALT 19 9 - 46 U/L  Lipid panel     Status: Abnormal   Collection Time: 04/02/19  9:26 AM  Result Value Ref Range   Cholesterol 137 <200 mg/dL   HDL 47 > OR = 40 mg/dL   Triglycerides 218 (H) <150 mg/dL    Comment: . If a non-fasting specimen was collected, consider repeat triglyceride testing on a fasting specimen if clinically indicated.  Yates Decamp et al. J. of Clin. Lipidol. 7408;1:448-185. Marland Kitchen    LDL Cholesterol (Calc) 61 mg/dL (calc)    Comment: Reference range: <100 . Desirable range <100 mg/dL for primary prevention;   <70 mg/dL for patients with CHD or diabetic patients  with > or = 2 CHD risk factors. Marland Kitchen LDL-C is now calculated using the Martin-Hopkins  calculation, which is a validated novel method providing  better accuracy than the Friedewald equation in the  estimation of LDL-C.  Cresenciano Genre et al. Annamaria Helling. 6314;970(26): 2061-2068  (http://education.[employer].com/faq/FAQ164)    Total CHOL/HDL Ratio 2.9 <5.0 (calc)   Non-HDL  Cholesterol (Calc) 90 <130 mg/dL (calc)    Comment: For patients with diabetes plus 1 major ASCVD risk  factor, treating to a non-HDL-C goal of <100 mg/dL  (LDL-C of <70 mg/dL) is considered a therapeutic  option.   TSH     Status: None   Collection Time: 04/02/19  9:26 AM  Result Value Ref Range   TSH 1.39 0.40 - 4.50 mIU/L  Hemoglobin A1c     Status: Abnormal   Collection Time: 04/02/19  9:26 AM  Result Value Ref Range   Hgb A1c MFr Bld 6.6 (H) <5.7 % of total Hgb    Comment: For someone without known diabetes, a hemoglobin A1c value of 6.5% or greater indicates that they may have  diabetes and this should be confirmed with a follow-up  test. . For someone with known diabetes, a value <7% indicates  that their diabetes is well controlled and a value  greater than or equal to 7% indicates suboptimal  control. A1c targets should be individualized based on  duration of diabetes, age, comorbid conditions, and  other considerations. . Currently, no consensus exists regarding use of hemoglobin A1c for diagnosis of diabetes for children. .    Mean Plasma Glucose 143 (calc)   eAG (mmol/L) 7.9 (calc)  Testosterone     Status: Abnormal   Collection Time: 04/02/19  9:26 AM  Result Value Ref Range   Testosterone 960 (H) 250 - 827 ng/dL   No results found.     Assessment and Plan: 48 y.o. male with The primary encounter diagnosis was Controlled type 2 diabetes mellitus without complication, without long-term current use of insulin ([place]). Diagnoses of Testosterone deficiency, Major depressive disorder with single episode, in partial remission ([place]), Hyperlipidemia associated with type 2 diabetes mellitus ([place]), Low testosterone, Other insomnia, and Gastroesophageal reflux disease, esophagitis presence not specified were also pertinent to this visit.  Trial trazodone for sleep, will start omeprazole for 8-week course, plan to recheck blood work in another 3 months, patient advised to  take testosterone as prescribed 1.5 mL weekly and will also plan to recheck these levels in another 3 months.  Declines medication/intervention for mental health at this time, patient advised to let me know if he changes his mind on this.  PDMP not reviewed this encounter. Orders Placed This Encounter  Procedures  . CBC  . Lipid panel  . Hemoglobin A1c  . Testosterone  Meds ordered this encounter  Medications  . traZODone (DESYREL) 50 MG tablet    Sig: Take 0.5-2 tablets (25-100 mg total) by mouth at bedtime as needed for sleep.    Dispense:  60 tablet    Refill:  0  . omeprazole (PRILOSEC) 40 MG capsule    Sig: Take 1 capsule (40 mg total) by mouth daily.    Dispense:  90 capsule    Refill:  0       Follow Up Instructions: Return in about 3 months (around 07/05/2019) for Monitor diabetes, testosterone, cholesterol.  Labs prior to virtual visit, orders are in..    I discussed the assessment and treatment plan with the patient. The patient was provided an opportunity to ask questions and all were answered. The patient agreed with the plan and demonstrated an understanding of the instructions.   The patient was advised to call back or seek an in-person evaluation if any new concerns, if symptoms worsen or if the condition fails to improve as anticipated.  25 minutes of non-face-to-face time was provided during this encounter.                      Historical information moved to improve visibility of documentation.  Past Medical History:  Diagnosis Date  . Diabetes mellitus without complication ([place])   . Hyperlipidemia    Past Surgical History:  Procedure Laterality Date  . HERNIA REPAIR    . VASECTOMY     Social History   Tobacco Use  . Smoking status: Never Smoker  . Smokeless tobacco: Never Used  Substance Use Topics  . Alcohol use: Not on file   family history includes Alcohol abuse in his mother; Cancer in his mother; Diabetes in his  father.  Medications: Current Outpatient Medications  Medication Sig Dispense Refill  . atorvastatin (LIPITOR) 40 MG tablet Take 1 tablet (40 mg total) by mouth daily. 90 tablet 3  . blood glucose meter kit and supplies KIT Dispense based on patient and insurance preference. Use as directed up to bid. Dx: E11.65 1 each 0  . Insulin Pen Needle (BD PEN NEEDLE NANO U/F) 32G X 4 MM MISC USE UPTO 4 TIMES A DAY 100 each 0  . Insulin Pen Needle (NOVOFINE PLUS) 32G X 4 MM MISC USE AS DIRECTED 100 each 0  . metFORMIN (GLUCOPHAGE-XR) 500 MG 24 hr tablet TAKE 2 TABLETS BY MOUTH EVERY DAY WITH BREAKFAST 60 tablet 0  . NEEDLE, DISP, 18 G 18G X 1" MISC Use with testosterone as directed to draw 100 each 99  . NEEDLE, DISP, 22 G 22G X 1-1/2" MISC Use with testosterone as directed to inject 100 each 99  . Semaglutide,0.25 or 0.5MG/DOS, (OZEMPIC, 0.25 OR 0.5 MG/DOSE,) 2 MG/1.5ML SOPN Inject 0.5 mg into the skin once a week. 4 pen 5  . Syringe, Disposable, (SYRINGE LUER LOCK) 3 ML MISC Use with testosterone as directed 100 each 1  . testosterone cypionate (DEPOTESTOTERONE CYPIONATE) 100 MG/ML injection Inject 1.5 mLs (150 mg total) into the muscle every 7 (seven) days. 10 mL 3  . buPROPion (WELLBUTRIN XL) 300 MG 24 hr tablet Take 1 tablet (300 mg total) by mouth daily. (Patient not taking: Reported on 04/04/2019) 90 tablet 1  . omeprazole (PRILOSEC) 40 MG capsule Take 1 capsule (40 mg total) by mouth daily. 90 capsule 0  . ondansetron (ZOFRAN-ODT) 4 MG disintegrating tablet Take 2 tablets (8 mg total) by mouth every 8 (eight) hours as  needed for nausea or vomiting. (Patient not taking: Reported on 04/04/2019) 20 tablet 3  . traZODone (DESYREL) 50 MG tablet Take 0.5-2 tablets (25-100 mg total) by mouth at bedtime as needed for sleep. 60 tablet 0   No current facility-administered medications for this visit.    No Known Allergies  PDMP not reviewed this encounter. Orders Placed This Encounter  Procedures  . CBC   . Lipid panel  . Hemoglobin A1c  . Testosterone   Meds ordered this encounter  Medications  . traZODone (DESYREL) 50 MG tablet    Sig: Take 0.5-2 tablets (25-100 mg total) by mouth at bedtime as needed for sleep.    Dispense:  60 tablet    Refill:  0  . omeprazole (PRILOSEC) 40 MG capsule    Sig: Take 1 capsule (40 mg total) by mouth daily.    Dispense:  90 capsule    Refill:  0

## 2019-04-04 NOTE — Telephone Encounter (Signed)
I called pt and left a voicemail for patient to schedule his Doximity 3 mo follow up appt with Dr.Alexander

## 2019-04-04 NOTE — Telephone Encounter (Signed)
-----   Message from Emeterio Reeve, DO sent at 04/04/2019  9:56 AM EDT ----- Return in about 3 months (around 07/05/2019) for Monitor diabetes, testosterone, cholesterol.  Labs prior to virtual visit, orders are in.Marland Kitchen

## 2019-04-13 NOTE — Telephone Encounter (Signed)
Left message for patient to return clinic call, callback information provided.

## 2019-04-29 ENCOUNTER — Other Ambulatory Visit: Payer: Self-pay | Admitting: Osteopathic Medicine

## 2019-05-07 ENCOUNTER — Other Ambulatory Visit: Payer: Self-pay | Admitting: Osteopathic Medicine

## 2019-05-07 DIAGNOSIS — E119 Type 2 diabetes mellitus without complications: Secondary | ICD-10-CM

## 2019-06-29 ENCOUNTER — Other Ambulatory Visit: Payer: Self-pay | Admitting: Osteopathic Medicine

## 2019-06-29 NOTE — Telephone Encounter (Signed)
Forwarding medication refill request to PCP for review. 

## 2019-07-23 ENCOUNTER — Other Ambulatory Visit: Payer: Self-pay | Admitting: Osteopathic Medicine

## 2019-07-23 DIAGNOSIS — E119 Type 2 diabetes mellitus without complications: Secondary | ICD-10-CM

## 2019-07-23 NOTE — Telephone Encounter (Signed)
Requested medication (s) are due for refill today: yes  Requested medication (s) are on the active medication list: yes  Last refill:  05/08/2019  Future visit scheduled: yes  Notes to clinic:  Patient has appointment on 07/30/2019   Requested Prescriptions  Pending Prescriptions Disp Refills   metFORMIN (GLUCOPHAGE-XR) 500 MG 24 hr tablet [Pharmacy Med Name: METFORMIN HCL ER 500 MG TABLET] 180 tablet 0    Sig: TAKE 2 TABLETS BY MOUTH EVERY DAY WITH BREAKFAST **PT OVERDUE FOR APPT W/ PCP**     Endocrinology:  Diabetes - Biguanides Failed - 07/23/2019 11:09 AM      Failed - Valid encounter within last 6 months    Recent Outpatient Visits          3 months ago Controlled type 2 diabetes mellitus without complication, without long-term current use of insulin ([place])   [facility], Lanelle Bal, DO   9 months ago Controlled type 2 diabetes mellitus without complication, without long-term current use of insulin ([place])   [facility], Lanelle Bal, DO   1 year ago Controlled type 2 diabetes mellitus without complication, without long-term current use of insulin ([place])   [facility], Lanelle Bal, DO   1 year ago Controlled type 2 diabetes mellitus without complication, without long-term current use of insulin ([place])   [facility], Lanelle Bal, DO   1 year ago Controlled type 2 diabetes mellitus without complication, without long-term current use of insulin ([place])   [facility], Lanelle Bal, DO             Passed - Cr in normal range and within 360 days    Creat  Date Value Ref Range Status  04/02/2019 1.07 0.60 - 1.35 mg/dL Final         Passed - HBA1C is between 0 and 7.9 and within 180 days    Hgb A1c MFr Bld  Date Value Ref Range Status  04/02/2019 6.6 (H) <5.7 % of total Hgb  Final    Comment:    For someone without known diabetes, a hemoglobin A1c value of 6.5% or greater indicates that they may have  diabetes and this should be confirmed with a follow-up  test. . For someone with known diabetes, a value <7% indicates  that their diabetes is well controlled and a value  greater than or equal to 7% indicates suboptimal  control. A1c targets should be individualized based on  duration of diabetes, age, comorbid conditions, and  other considerations. . Currently, no consensus exists regarding use of hemoglobin A1c for diagnosis of diabetes for children. .          Passed - eGFR in normal range and within 360 days    GFR, Est African American  Date Value Ref Range Status  04/02/2019 94 > OR = 60 mL/min/1.24m Final   GFR, Est Non African American  Date Value Ref Range Status  04/02/2019 81 > OR = 60 mL/min/1.725mFinal

## 2019-07-25 LAB — CBC
HCT: 50.9 % — ABNORMAL HIGH (ref 38.5–50.0)
Hemoglobin: 17.4 g/dL — ABNORMAL HIGH (ref 13.2–17.1)
MCH: 31.6 pg (ref 27.0–33.0)
MCHC: 34.2 g/dL (ref 32.0–36.0)
MCV: 92.5 fL (ref 80.0–100.0)
MPV: 11.2 fL (ref 7.5–12.5)
Platelets: 202 10*3/uL (ref 140–400)
RBC: 5.5 10*6/uL (ref 4.20–5.80)
RDW: 12.1 % (ref 11.0–15.0)
WBC: 6 10*3/uL (ref 3.8–10.8)

## 2019-07-25 LAB — LIPID PANEL
Cholesterol: 135 mg/dL (ref <200)
HDL: 42 mg/dL (ref >=40)
LDL Cholesterol (Calc): 64 mg/dL (calc)
Non-HDL Cholesterol (Calc): 93 mg/dL (calc) (ref <130)
Total CHOL/HDL Ratio: 3.2 (calc) (ref <5.0)
Triglycerides: 233 mg/dL — ABNORMAL HIGH (ref <150)

## 2019-07-25 LAB — HEMOGLOBIN A1C
Hgb A1c MFr Bld: 7.1 % of total Hgb — ABNORMAL HIGH (ref <5.7)
Mean Plasma Glucose: 157 (calc)
eAG (mmol/L): 8.7 (calc)

## 2019-07-25 LAB — TESTOSTERONE: Testosterone: 1303 ng/dL — ABNORMAL HIGH (ref 250–827)

## 2019-07-30 ENCOUNTER — Ambulatory Visit (INDEPENDENT_AMBULATORY_CARE_PROVIDER_SITE_OTHER): Payer: BC Managed Care – PPO | Admitting: Osteopathic Medicine

## 2019-07-30 ENCOUNTER — Encounter: Payer: Self-pay | Admitting: Osteopathic Medicine

## 2019-07-30 ENCOUNTER — Telehealth: Payer: Self-pay | Admitting: Osteopathic Medicine

## 2019-07-30 DIAGNOSIS — E349 Endocrine disorder, unspecified: Secondary | ICD-10-CM

## 2019-07-30 DIAGNOSIS — E119 Type 2 diabetes mellitus without complications: Secondary | ICD-10-CM

## 2019-07-30 DIAGNOSIS — Z1211 Encounter for screening for malignant neoplasm of colon: Secondary | ICD-10-CM

## 2019-07-30 MED ORDER — ATORVASTATIN CALCIUM 40 MG PO TABS: 40.0000 mg | ORAL_TABLET | Freq: Every day | ORAL | 3 refills | Status: DC

## 2019-07-30 MED ORDER — TESTOSTERONE CYPIONATE 100 MG/ML IM SOLN: 150.0000 mg | INTRAMUSCULAR | 3 refills | Status: DC

## 2019-07-30 MED ORDER — METFORMIN HCL ER 500 MG PO TB24: 500.0000 mg | ORAL_TABLET | Freq: Every day | ORAL | 3 refills | Status: DC

## 2019-07-30 NOTE — Telephone Encounter (Signed)
Patient has called and left a VM that he called his insurance and they will pay for the cologuard testing. Can you place order and I can fax for this patient? Please advise.

## 2019-07-30 NOTE — Progress Notes (Signed)
Virtual Visit via Video (App used: Doximity) Note  I connected with      Roy Meyers on 07/30/19 at 8:15 AM by a telemedicine application and verified that I am speaking with the correct person using two identifiers.  Patient is at work I am in office   I discussed the limitations of evaluation and management by telemedicine and the availability of in person appointments. The patient expressed understanding and agreed to proceed.  History of Present Illness: Roy Meyers is a 48 y.o. male who would like to discuss diabetes, medications refilled  Will be losing insurance in 2021 would like to get caught up on anything he can   Colon cancer screening - no personal hx polyps, no FH colon cancer.   Hypogonadism: Doing well on testosterone   DM2: Doing well on metformin + Ozempic     Observations/Objective: Temp (!) 97.2 F (36.2 C) (Oral)   Ht 6' 1"  (1.854 m)   Wt 242 lb (109.8 kg)   BMI 31.93 kg/m  BP Readings from Last 3 Encounters:  10/03/18 135/78  05/16/18 119/75  02/08/18 119/76   Exam: Normal Speech.  NAD  Lab and Radiology Results No results found for this or any previous visit (from the past 72 hour(s)). No results found.  Depression screen [facility] 2/9 07/30/2019 04/04/2019 10/03/2018  Decreased Interest 0 1 1  Down, Depressed, Hopeless 0 1 2  PHQ - 2 Score 0 2 3  Altered sleeping 1 2 2   Tired, decreased energy 0 3 1  Change in appetite 0 0 2  Feeling bad or failure about yourself  0 1 1  Trouble concentrating 1 1 2   Moving slowly or fidgety/restless 0 0 0  Suicidal thoughts 0 0 0  PHQ-9 Score 2 9 11   Difficult doing work/chores Not difficult at all Very difficult Somewhat difficult   GAD 7 : Generalized Anxiety Score 07/30/2019 04/04/2019 10/03/2018 05/16/2018  Nervous, Anxious, on Edge 0 2 1 1   Control/stop worrying 0 3 2 1   Worry too much - different things 0 2 2 1   Trouble relaxing 0 3 2 2   Restless 0 2 2 2   Easily annoyed or irritable 1 2  2 2   Afraid - awful might happen 0 1 2 1   Total GAD 7 Score 1 15 13 10   Anxiety Difficulty Not difficult at all Very difficult Somewhat difficult Somewhat difficult       Assessment and Plan: 48 y.o. male with Diagnoses of Controlled type 2 diabetes mellitus without complication, without long-term current use of insulin ([place]), Controlled type 2 diabetes mellitus without complication, without long-term current use of insulin ([place]), and Testosterone deficiency were pertinent to this visit.   PDMP not reviewed this encounter. No orders of the defined types were placed in this encounter.  Meds ordered this encounter  Medications  . metFORMIN (GLUCOPHAGE-XR) 500 MG 24 hr tablet    Sig: Take 1 tablet (500 mg total) by mouth daily with breakfast.    Dispense:  90 tablet    Refill:  3  . atorvastatin (LIPITOR) 40 MG tablet    Sig: Take 1 tablet (40 mg total) by mouth daily.    Dispense:  90 tablet    Refill:  3  . testosterone cypionate (DEPOTESTOTERONE CYPIONATE) 100 MG/ML injection    Sig: Inject 1.5 mLs (150 mg total) into the muscle every 7 (seven) days.    Dispense:  10 mL    Refill:  3    Not to exceed 5 additional fills before 06/18/2018.  Marland Kitchen Exenatide ER 2 MG PEN    Sig: Inject 1 mg into the skin once a week.    Dispense:  12 each    Refill:  1     Follow Up Instructions: Return in about 3 months (around 10/28/2019) for A1C check in no later than 6 mos (01/30/20).    I discussed the assessment and treatment plan with the patient. The patient was provided an opportunity to ask questions and all were answered. The patient agreed with the plan and demonstrated an understanding of the instructions.   The patient was advised to call back or seek an in-person evaluation if any new concerns, if symptoms worsen or if the condition fails to improve as anticipated.  25 minutes of non-face-to-face time was provided during this  encounter.      . . . . . . . . . . . . . Marland Kitchen                   Historical information moved to improve visibility of documentation.  Past Medical History:  Diagnosis Date  . Diabetes mellitus without complication ([place])   . Hyperlipidemia    Past Surgical History:  Procedure Laterality Date  . HERNIA REPAIR    . VASECTOMY     Social History   Tobacco Use  . Smoking status: Never Smoker  . Smokeless tobacco: Never Used  Substance Use Topics  . Alcohol use: Not on file   family history includes Alcohol abuse in his mother; Cancer in his mother; Diabetes in his father.  Medications: Current Outpatient Medications  Medication Sig Dispense Refill  . atorvastatin (LIPITOR) 40 MG tablet Take 1 tablet (40 mg total) by mouth daily. 90 tablet 3  . blood glucose meter kit and supplies KIT Dispense based on patient and insurance preference. Use as directed up to bid. Dx: E11.65 1 each 0  . Insulin Pen Needle (BD PEN NEEDLE NANO U/F) 32G X 4 MM MISC USE UPTO 4 TIMES A DAY 100 each 0  . Insulin Pen Needle (NOVOFINE PLUS) 32G X 4 MM MISC USE AS DIRECTED 100 each 0  . metFORMIN (GLUCOPHAGE-XR) 500 MG 24 hr tablet TAKE 2 TABLETS BY MOUTH EVERY DAY WITH BREAKFAST **PT OVERDUE FOR APPT W/ PCP** 30 tablet 0  . NEEDLE, DISP, 18 G 18G X 1" MISC Use with testosterone as directed to draw 100 each 99  . NEEDLE, DISP, 22 G 22G X 1-1/2" MISC Use with testosterone as directed to inject 100 each 99  . Semaglutide,0.25 or 0.5MG/DOS, (OZEMPIC, 0.25 OR 0.5 MG/DOSE,) 2 MG/1.5ML SOPN Inject 0.5 mg into the skin once a week. 4 pen 5  . Syringe, Disposable, (SYRINGE LUER LOCK) 3 ML MISC Use with testosterone as directed 100 each 1  . testosterone cypionate (DEPOTESTOTERONE CYPIONATE) 100 MG/ML injection Inject 1.5 mLs (150 mg total) into the muscle every 7 (seven) days. 10 mL 3   No current facility-administered medications for this visit.    No Known Allergies

## 2019-07-31 MED ORDER — EXENATIDE ER 2 MG ~~LOC~~ PEN: 1.0000 mg | PEN_INJECTOR | SUBCUTANEOUS | 1 refills | Status: DC

## 2019-08-02 ENCOUNTER — Ambulatory Visit: Payer: BC Managed Care – PPO | Admitting: Osteopathic Medicine

## 2019-08-03 ENCOUNTER — Telehealth: Payer: Self-pay | Admitting: Osteopathic Medicine

## 2019-08-03 NOTE — Telephone Encounter (Signed)
Received fax for prior authorization on Bydureon 2 mg sent through cover my meds waiting on determination. - CF

## 2019-08-03 NOTE — Telephone Encounter (Signed)
Received fax from [place] and medication has been approved   Valid: 08/03/19 - 08/02/20

## 2019-08-15 LAB — COLOGUARD: Cologuard: NEGATIVE

## 2019-08-29 ENCOUNTER — Telehealth: Payer: Self-pay

## 2019-08-29 ENCOUNTER — Encounter: Payer: Self-pay | Admitting: Osteopathic Medicine

## 2019-08-29 NOTE — Telephone Encounter (Signed)
No. Can do virtual with NP tomorrow

## 2019-08-29 NOTE — Telephone Encounter (Signed)
Pt left a vm msg stating that he has a sinus infection. Requesting for provider to send in a rx for antibiotics. As per pt, he does not have or been exposed to Covid and does not want to have a visit for a "just a sinus infection". Denies any other symptoms. Pt wants rx sent to [facility]. Pls advise, thanks.

## 2019-08-29 NOTE — Telephone Encounter (Signed)
Patient scheduled.

## 2019-08-30 ENCOUNTER — Ambulatory Visit (INDEPENDENT_AMBULATORY_CARE_PROVIDER_SITE_OTHER): Payer: BC Managed Care – PPO | Admitting: Nurse Practitioner

## 2019-08-30 ENCOUNTER — Encounter: Payer: Self-pay | Admitting: Nurse Practitioner

## 2019-08-30 ENCOUNTER — Other Ambulatory Visit: Payer: Self-pay

## 2019-08-30 VITALS — Temp 98.6°F | Wt 238.0 lb

## 2019-08-30 DIAGNOSIS — J0110 Acute frontal sinusitis, unspecified: Secondary | ICD-10-CM

## 2019-08-30 MED ORDER — AMOXICILLIN-POT CLAVULANATE 875-125 MG PO TABS: 1.0000 | ORAL_TABLET | Freq: Two times a day (BID) | ORAL | 0 refills | Status: DC

## 2019-08-30 NOTE — Progress Notes (Signed)
Virtual Visit via Video Note  I connected with Roy Meyers on 08/30/19 at  9:30 AM EST by a video enabled telemedicine application and verified that I am speaking with the correct person using two identifiers.   I discussed the limitations of evaluation and management by telemedicine and the availability of in person appointments. The patient expressed understanding and agreed to proceed.  Subjective:    CC: Sinus pain, Headache  HPI: Roy Meyers is a pleasant 48 year old male presenting today via Doximity video visit with sinus pain/pressure, increased mucous production, nose bleeds ,and headache. Sinus pressure with clear/pink tinged drainage began about a week ago and has progressed to a dry cough and bright green draining with intermittent nose bleeds. He has been taking Zyrtec D and Zycam which he reports are helping some, but his symptoms are worsening.  He reports a history of sinus issues in past years. He has not been on antibiotics in the past 3 months.    Past medical history, Surgical history, Family history not pertinant except as noted below, Social history, Allergies, and medications have been entered into the medical record, reviewed, and corrections made.   Review of Systems:  Positive for headache, sinus pain/pressure primarily in the frontal region an worse over the right eye, rhinitis, post nasal drip, non productive cough, and ear pain/pressure bilaterally.  No fevers, chills, fatigue, night sweats, weight loss, chest pain, or shortness of breath.  No loss of taste or smell, sore throat, difficulty swallowing.  No nausea, vomiting, diarrhea. No known COVID exposure  Objective:    General: Speaking clearly in complete sentences without any shortness of breath.  Alert and oriented x3.  Normal judgment. No apparent acute distress. Audibly congested. No physical exam due to video visit.   Impression and Recommendations:    Problem List Items Addressed This Visit     None    Visit Diagnoses    Acute non-recurrent frontal sinusitis    -  Primary   Rx for Augmentin sent. Continue Zyrtec and Zycam. May try mucinex, saline nasal spray, or delsym for symptoms. Tylenol or Ibuprofen may be used for pain.    Relevant Medications   amoxicillin-clavulanate (AUGMENTIN) 875-125 MG tablet      Meds ordered this encounter  Medications  . amoxicillin-clavulanate (AUGMENTIN) 875-125 MG tablet    Sig: Take 1 tablet by mouth 2 (two) times daily.    Dispense:  20 tablet    Refill:  0    Order Specific Question:   Supervising Provider    Answer:   Ricard Dillon [phone]      I discussed the assessment and treatment plan with the patient. The patient was provided an opportunity to ask questions and all were answered. The patient agreed with the plan and demonstrated an understanding of the instructions.   The patient was advised to call back or seek an in-person evaluation if the symptoms worsen or if the condition fails to improve as anticipated.  15 non-face-to-face time was provided during this encounter.   Orma Render, NP

## 2019-08-30 NOTE — Patient Instructions (Signed)
Augmentin, an antibiotic, has been called into your pharmacy. Please complete the entire prescription even if you feel better before it is finished.   You may continue to use the OTC Zyrtec and Zycam for your symptoms. Delsym, Mucinex, and/or Saline Nasal Spray may be helpful, as well. Ibuprofen and/or Tylenol alternating may be helpful for pain.   Rest and stay well hydrated. If your symptoms worsen or do not improve in about 3 days please let us know, you may need a medication change.   Thank you for allowing me to provide care for you today. Happy New Year!

## 2019-10-25 ENCOUNTER — Other Ambulatory Visit: Payer: Self-pay | Admitting: Osteopathic Medicine

## 2019-11-02 ENCOUNTER — Other Ambulatory Visit: Payer: Self-pay | Admitting: Osteopathic Medicine

## 2019-11-02 NOTE — Telephone Encounter (Signed)
As per pt, no longer taking trazodone. He will inform the pharmacy to stop auto refill request. Pt mentioned he is currently without insurance. Unable to afford paying OOP for Ozempic. Was wondering if provider has some suggestion because he would like to continue taking the medication. Pls advise, thanks.

## 2019-11-02 NOTE — Telephone Encounter (Signed)
Please confirm request with the patient, did he suggest this or was this still on automatic refill?  I am fine with refilling it just 1 to make sure we have an accurate medication list.

## 2019-11-02 NOTE — Telephone Encounter (Signed)
Requested  medications are  due for refill today yes  Requested medications are on the active medication list no  Last refill 12/1  Future visit scheduled no  Notes to clinic

## 2019-11-02 NOTE — Telephone Encounter (Signed)
Would have him to go ozempic website there should be a form for patient assistance to help get free Rx

## 2019-11-02 NOTE — Telephone Encounter (Signed)
Pt has been updated and aware of web site information regarding Ozempic rx. Pt will contact the office as needed.

## 2019-12-24 ENCOUNTER — Other Ambulatory Visit: Payer: Self-pay | Admitting: Osteopathic Medicine

## 2020-01-27 ENCOUNTER — Other Ambulatory Visit: Payer: Self-pay | Admitting: Osteopathic Medicine

## 2020-01-27 DIAGNOSIS — E349 Endocrine disorder, unspecified: Secondary | ICD-10-CM

## 2020-01-29 NOTE — Telephone Encounter (Signed)
Last written on 07/30/19.

## 2020-01-29 NOTE — Telephone Encounter (Signed)
Please advise if refill appropriate.

## 2020-05-03 ENCOUNTER — Other Ambulatory Visit: Payer: Self-pay | Admitting: Osteopathic Medicine

## 2020-05-03 DIAGNOSIS — E119 Type 2 diabetes mellitus without complications: Secondary | ICD-10-CM

## 2020-05-30 ENCOUNTER — Other Ambulatory Visit: Payer: Self-pay | Admitting: Osteopathic Medicine

## 2020-05-30 DIAGNOSIS — E349 Endocrine disorder, unspecified: Secondary | ICD-10-CM

## 2020-05-30 NOTE — Telephone Encounter (Signed)
Routing to covering provider.

## 2020-06-18 ENCOUNTER — Telehealth: Payer: Self-pay | Admitting: Osteopathic Medicine

## 2020-06-18 DIAGNOSIS — E1169 Type 2 diabetes mellitus with other specified complication: Secondary | ICD-10-CM

## 2020-06-18 DIAGNOSIS — E119 Type 2 diabetes mellitus without complications: Secondary | ICD-10-CM

## 2020-06-18 DIAGNOSIS — E349 Endocrine disorder, unspecified: Secondary | ICD-10-CM

## 2020-06-18 DIAGNOSIS — Z0000 Encounter for general adult medical examination without abnormal findings: Secondary | ICD-10-CM

## 2020-06-18 NOTE — Addendum Note (Signed)
Addended by: Towana Badger on: 06/18/2020 02:19 PM   Modules accepted: Orders

## 2020-06-18 NOTE — Telephone Encounter (Signed)
PT has a physical scheduled on 07/09/20. Wants bloodwork put in. He will be coming in 06/30/20.   Please advise.  Mentioned:Testosterone, Diabetes Check

## 2020-06-18 NOTE — Telephone Encounter (Signed)
Labs pended.

## 2020-06-25 NOTE — Telephone Encounter (Signed)
Orders in 

## 2020-06-25 NOTE — Telephone Encounter (Signed)
Patient advised.

## 2020-06-25 NOTE — Addendum Note (Signed)
Addended by: Maryla Morrow on: 06/25/2020 10:24 AM   Modules accepted: Orders

## 2020-06-27 ENCOUNTER — Other Ambulatory Visit: Payer: Self-pay | Admitting: Osteopathic Medicine

## 2020-06-27 DIAGNOSIS — E119 Type 2 diabetes mellitus without complications: Secondary | ICD-10-CM

## 2020-07-09 ENCOUNTER — Encounter: Payer: BC Managed Care – PPO | Admitting: Osteopathic Medicine

## 2020-07-10 ENCOUNTER — Other Ambulatory Visit: Payer: Self-pay | Admitting: Osteopathic Medicine

## 2020-07-10 DIAGNOSIS — E119 Type 2 diabetes mellitus without complications: Secondary | ICD-10-CM

## 2020-07-14 LAB — COMPLETE METABOLIC PANEL WITH GFR
AG Ratio: 2.3 (calc) (ref 1.0–2.5)
ALT: 24 U/L (ref 9–46)
AST: 19 U/L (ref 10–35)
Albumin: 4.3 g/dL (ref 3.6–5.1)
Alkaline phosphatase (APISO): 50 U/L (ref 35–144)
BUN: 11 mg/dL (ref 7–25)
CO2: 25 mmol/L (ref 20–32)
Calcium: 9 mg/dL (ref 8.6–10.3)
Chloride: 101 mmol/L (ref 98–110)
Creat: 1.09 mg/dL (ref 0.70–1.33)
GFR, Est African American: 91 mL/min/{1.73_m2} (ref >=60)
GFR, Est Non African American: 79 mL/min/{1.73_m2} (ref >=60)
Globulin: 1.9 g/dL (calc) (ref 1.9–3.7)
Glucose, Bld: 209 mg/dL — ABNORMAL HIGH (ref 65–99)
Potassium: 4.9 mmol/L (ref 3.5–5.3)
Sodium: 135 mmol/L (ref 135–146)
Total Bilirubin: 0.9 mg/dL (ref 0.2–1.2)
Total Protein: 6.2 g/dL (ref 6.1–8.1)

## 2020-07-14 LAB — HEMOGLOBIN A1C
Hgb A1c MFr Bld: 7.9 % of total Hgb — ABNORMAL HIGH (ref <5.7)
Mean Plasma Glucose: 180 (calc)
eAG (mmol/L): 10 (calc)

## 2020-07-14 LAB — LIPID PANEL W/REFLEX DIRECT LDL
Cholesterol: 141 mg/dL (ref <200)
HDL: 43 mg/dL (ref >=40)
LDL Cholesterol (Calc): 67 mg/dL (calc)
Non-HDL Cholesterol (Calc): 98 mg/dL (calc) (ref <130)
Total CHOL/HDL Ratio: 3.3 (calc) (ref <5.0)
Triglycerides: 267 mg/dL — ABNORMAL HIGH (ref <150)

## 2020-07-14 LAB — CBC
HCT: 48.3 % (ref 38.5–50.0)
Hemoglobin: 17.1 g/dL (ref 13.2–17.1)
MCH: 33.1 pg — ABNORMAL HIGH (ref 27.0–33.0)
MCHC: 35.4 g/dL (ref 32.0–36.0)
MCV: 93.6 fL (ref 80.0–100.0)
MPV: 11.5 fL (ref 7.5–12.5)
Platelets: 178 10*3/uL (ref 140–400)
RBC: 5.16 10*6/uL (ref 4.20–5.80)
RDW: 11.7 % (ref 11.0–15.0)
WBC: 5.7 10*3/uL (ref 3.8–10.8)

## 2020-07-14 LAB — PSA, TOTAL WITH REFLEX TO PSA, FREE: PSA, Total: 0.5 ng/mL (ref <=4.0)

## 2020-07-14 LAB — TESTOSTERONE: Testosterone: 799 ng/dL (ref 250–827)

## 2020-07-15 ENCOUNTER — Ambulatory Visit (INDEPENDENT_AMBULATORY_CARE_PROVIDER_SITE_OTHER): Payer: 59 | Admitting: Osteopathic Medicine

## 2020-07-15 ENCOUNTER — Encounter: Payer: Self-pay | Admitting: Osteopathic Medicine

## 2020-07-15 VITALS — BP 144/87 | HR 74 | Temp 98.1°F | Wt 260.0 lb

## 2020-07-15 DIAGNOSIS — E785 Hyperlipidemia, unspecified: Secondary | ICD-10-CM

## 2020-07-15 DIAGNOSIS — E349 Endocrine disorder, unspecified: Secondary | ICD-10-CM

## 2020-07-15 DIAGNOSIS — Z0000 Encounter for general adult medical examination without abnormal findings: Secondary | ICD-10-CM | POA: Diagnosis not present

## 2020-07-15 DIAGNOSIS — E1169 Type 2 diabetes mellitus with other specified complication: Secondary | ICD-10-CM | POA: Diagnosis not present

## 2020-07-15 DIAGNOSIS — E1165 Type 2 diabetes mellitus with hyperglycemia: Secondary | ICD-10-CM

## 2020-07-15 DIAGNOSIS — E119 Type 2 diabetes mellitus without complications: Secondary | ICD-10-CM

## 2020-07-15 MED ORDER — METFORMIN HCL ER 750 MG PO TB24: 750.0000 mg | ORAL_TABLET | Freq: Every day | ORAL | 3 refills | Status: DC

## 2020-07-15 MED ORDER — TESTOSTERONE CYPIONATE 200 MG/ML IM SOLN: INTRAMUSCULAR | 3 refills | Status: DC

## 2020-07-15 MED ORDER — OZEMPIC (1 MG/DOSE) 2 MG/1.5ML ~~LOC~~ SOPN: 1.0000 mg | PEN_INJECTOR | SUBCUTANEOUS | 5 refills | Status: DC

## 2020-07-15 MED ORDER — ATORVASTATIN CALCIUM 40 MG PO TABS: 40.0000 mg | ORAL_TABLET | Freq: Every day | ORAL | 3 refills | Status: DC

## 2020-07-15 NOTE — Patient Instructions (Addendum)
General Preventive Care  Most recent routine screening labs: reviewed.   Blood pressure goal 130/80 or less.   Tobacco: don't!   Alcohol: responsible moderation is ok for most adults - if you have concerns about your alcohol intake, please talk to me!   Exercise: as tolerated to reduce risk of cardiovascular disease and diabetes. Strength training will also prevent osteoporosis.   Mental health: if need for mental health care (medicines, counseling, other), or concerns about moods, please let me know!    Sexual / Reproductive health: if need for STD testing, or if concerns with libido/pain problems, please let me know!   Advanced Directive: Living Will and/or Healthcare Power of Attorney recommended for all adults, regardless of age or health.  Vaccines  Flu vaccine: recommended every fall.   Shingles vaccine: after age 77.   Pneumonia vaccines: recommended once for diabetics under age 41, and booster after age 15  Tetanus booster: every 10 years - due 2029  COVID vaccine: THANKS for getting your vaccine! :)  Cancer screenings   Colon cancer screening: for everyone age 66-75. Due 2023 (repeat Cologuard or colonoscopy)   Prostate cancer screening: PSA blood test age 33-71  Lung cancer screening: not needed for non-smokers  Infection screenings  . HIV: recommended screening at least once age 16-65, more often as needed. . Gonorrhea/Chlamydia: screening as needed . Hepatitis C: recommended once for everyone age 33-75 . TB: certain at-risk populations, or depending on work requirements and/or travel history Other . Bone Density Test: recommended for men at age 33 . Abdominal Aortic Aneurysm: screening with ultrasound recommended once for men age 47-75 who have ever smoked

## 2020-07-15 NOTE — Progress Notes (Signed)
Roy Meyers is a 49 y.o. male who presents to  [place] [facility]  today, 07/15/20, seeking care for the following:  . Annual physical      ASSESSMENT & PLAN with other pertinent findings:  The primary encounter diagnosis was Annual physical exam. Diagnoses of Testosterone deficiency, Hyperlipidemia associated with type 2 diabetes mellitus ([place]), Uncontrolled type 2 diabetes mellitus with hyperglycemia ([place]), Controlled type 2 diabetes mellitus without complication, without long-term current use of insulin ([place]), and Controlled type 2 diabetes mellitus without complication, without long-term current use of insulin ([place]) were also pertinent to this visit.  Reviewed labs, A1c shows suboptimal control of diabetes, we decided to increase Ozempic.  May need to maintain/increase Metformin, there might be some insurance coverage issues with the 500 mg but if 750 mg is covered that is fine    Patient Instructions  General Preventive Care  Most recent routine screening labs: reviewed.   Blood pressure goal 130/80 or less.   Tobacco: don't!   Alcohol: responsible moderation is ok for most adults - if you have concerns about your alcohol intake, please talk to me!   Exercise: as tolerated to reduce risk of cardiovascular disease and diabetes. Strength training will also prevent osteoporosis.   Mental health: if need for mental health care (medicines, counseling, other), or concerns about moods, please let me know!    Sexual / Reproductive health: if need for STD testing, or if concerns with libido/pain problems, please let me know!   Advanced Directive: Living Will and/or Healthcare Power of Attorney recommended for all adults, regardless of age or health.  Vaccines  Flu vaccine: recommended every fall.   Shingles vaccine: after age 43.   Pneumonia vaccines: recommended once for diabetics under age 67, and booster after age  50  Tetanus booster: every 10 years - due 2029  COVID vaccine: THANKS for getting your vaccine! :)  Cancer screenings   Colon cancer screening: for everyone age 33-75. Due 2023 (repeat Cologuard or colonoscopy)   Prostate cancer screening: PSA blood test age 66-71  Lung cancer screening: not needed for non-smokers  Infection screenings  . HIV: recommended screening at least once age 16-65, more often as needed. . Gonorrhea/Chlamydia: screening as needed . Hepatitis C: recommended once for everyone age 69-75 . TB: certain at-risk populations, or depending on work requirements and/or travel history Other . Bone Density Test: recommended for men at age 50 . Abdominal Aortic Aneurysm: screening with ultrasound recommended once for men age 55-75 who have ever smoked    No orders of the defined types were placed in this encounter.   Meds ordered this encounter  Medications  . atorvastatin (LIPITOR) 40 MG tablet    Sig: Take 1 tablet (40 mg total) by mouth daily.    Dispense:  90 tablet    Refill:  3  . metFORMIN (GLUCOPHAGE-XR) 750 MG 24 hr tablet    Sig: Take 1 tablet (750 mg total) by mouth daily with breakfast.    Dispense:  90 tablet    Refill:  3  . testosterone cypionate (DEPOTESTOSTERONE CYPIONATE) 200 MG/ML injection    Sig: INJECT 0.75 MLS (150 MG TOTAL) INTO THE MUSCLE EVERY 7 (SEVEN) DAYS.    Dispense:  4 mL    Refill:  3  . Semaglutide, 1 MG/DOSE, (OZEMPIC, 1 MG/DOSE,) 2 MG/1.5ML SOPN    Sig: Inject 1 mg into the skin once a week.    Dispense:  7.5 mL    Refill:  5       Follow-up instructions: Return in about 4 months (around 11/12/2020) for MONITOR A1C, IN-OFFICE VISIT.                                         BP (!) 144/87 (BP Location: Left Arm, Patient Position: Sitting, Cuff Size: Large)   Pulse 74   Temp 98.1 F (36.7 C) (Oral)   Wt 260 lb (117.9 kg)   BMI 34.30 kg/m   Current Meds  Medication Sig  .  atorvastatin (LIPITOR) 40 MG tablet Take 1 tablet (40 mg total) by mouth daily.  . blood glucose meter kit and supplies KIT Dispense based on patient and insurance preference. Use as directed up to bid. Dx: E11.65  . Insulin Pen Needle (BD PEN NEEDLE NANO U/F) 32G X 4 MM MISC USE UPTO 4 TIMES A DAY  . Insulin Pen Needle (NOVOFINE PLUS) 32G X 4 MM MISC USE AS DIRECTED  . metFORMIN (GLUCOPHAGE-XR) 750 MG 24 hr tablet Take 1 tablet (750 mg total) by mouth daily with breakfast.  . NEEDLE, DISP, 18 G 18G X 1" MISC Use with testosterone as directed to draw  . NEEDLE, DISP, 22 G 22G X 1-1/2" MISC Use with testosterone as directed to inject  . Syringe, Disposable, (SYRINGE LUER LOCK) 3 ML MISC Use with testosterone as directed  . testosterone cypionate (DEPOTESTOSTERONE CYPIONATE) 200 MG/ML injection INJECT 0.75 MLS (150 MG TOTAL) INTO THE MUSCLE EVERY 7 (SEVEN) DAYS.  . [DISCONTINUED] atorvastatin (LIPITOR) 40 MG tablet Take 1 tablet (40 mg total) by mouth daily.  . [DISCONTINUED] metFORMIN (GLUCOPHAGE-XR) 500 MG 24 hr tablet TAKE 1 TABLET BY MOUTH EVERY DAY WITH BREAKFAST  . [DISCONTINUED] OZEMPIC, 0.25 OR 0.5 MG/DOSE, 2 MG/1.5ML SOPN INJECT 0.5 MG INTO THE SKIN ONCE A WEEK.  . [DISCONTINUED] testosterone cypionate (DEPOTESTOSTERONE CYPIONATE) 200 MG/ML injection INJECT 0.75 MLS (150 MG TOTAL) INTO THE MUSCLE EVERY 7 (SEVEN) DAYS.    No results found for this or any previous visit (from the past 72 hour(s)).  No results found.  Constitutional:  . VSS, see nurse notes . General Appearance: alert, well-developed, well-nourished, NAD Eyes: Marland Kitchen Normal lids and conjunctive, non-icteric sclera Neck: . No masses, trachea midline . No thyroid enlargement/tenderness/mass appreciated Respiratory: . Normal respiratory effort . Breath sounds normal, no wheeze/rhonchi/rales Cardiovascular: . S1/S2 normal, no murmur/rub/gallop auscultated . No lower extremity edema Gastrointestinal: . Nontender, no  masses . No hepatomegaly, no splenomegaly . No hernia appreciated Musculoskeletal:  . Gait normal . No clubbing/cyanosis of digits Neurological: . No cranial nerve deficit on limited exam . Motor and sensation intact and symmetric Psychiatric: . Normal judgment/insight . Normal mood and affect     All questions at time of visit were answered - patient instructed to contact office with any additional concerns or updates.  ER/RTC precautions were reviewed with the patient as applicable.   Please note: voice recognition software was used to produce this document, and typos may escape review. Please contact Dr. Sheppard Coil for any needed clarifications.

## 2020-08-07 ENCOUNTER — Telehealth: Payer: Self-pay

## 2020-08-07 MED ORDER — OZEMPIC (0.25 OR 0.5 MG/DOSE) 2 MG/1.5ML ~~LOC~~ SOPN: 0.5000 mg | PEN_INJECTOR | SUBCUTANEOUS | 5 refills | Status: DC

## 2020-08-07 NOTE — Telephone Encounter (Signed)
Patient advised. he will call back when  he gets information

## 2020-08-07 NOTE — Telephone Encounter (Signed)
Pt called stating that with the increase of Ozempic dosage, cost of the rx went from $75 to $500. Pt is requesting an alternative rx or different dose that will be covered by his insurance. Pls advise, thanks.

## 2020-08-07 NOTE — Telephone Encounter (Signed)
Pt returned a call back to the clinic. He contacted his insurance regarding his insulin rx. Per pt, Bydureon is not available through insurance and Trulicity is in the same tier/pricing as Ozempic. Pt is requesting to go back to the original dose of Ozempic because it was cost efficient. He mentioned that at his last appointment you discussed increasing his metformin rx as a possibility. Pt wants to move forward with that recommendation. Pls advise, thanks.

## 2020-08-07 NOTE — Telephone Encounter (Signed)
Sent Rx Sending FYI to [employer] - have we seen PA for Ozempic for higher dose but not for lower dose? Seems weird cost would change so much. Thanks.

## 2020-08-07 NOTE — Telephone Encounter (Signed)
Task completed. Pt was updated of rx changes. No other inquiries during the call.

## 2020-08-07 NOTE — Telephone Encounter (Signed)
Pt needs to contact insurance - cost increase for normal expected dose increase doesn't make sense, but he should see if they cover Trulicity or Bydureon

## 2020-10-20 ENCOUNTER — Other Ambulatory Visit: Payer: Self-pay

## 2020-10-20 ENCOUNTER — Ambulatory Visit (INDEPENDENT_AMBULATORY_CARE_PROVIDER_SITE_OTHER): Payer: 59 | Admitting: Osteopathic Medicine

## 2020-10-20 VITALS — BP 133/87 | HR 73 | Ht 73.0 in | Wt 255.0 lb

## 2020-10-20 DIAGNOSIS — Z23 Encounter for immunization: Secondary | ICD-10-CM

## 2020-10-20 DIAGNOSIS — E1165 Type 2 diabetes mellitus with hyperglycemia: Secondary | ICD-10-CM

## 2020-10-20 DIAGNOSIS — R399 Unspecified symptoms and signs involving the genitourinary system: Secondary | ICD-10-CM | POA: Diagnosis not present

## 2020-10-20 LAB — POCT GLYCOSYLATED HEMOGLOBIN (HGB A1C): Hemoglobin A1C: 9 % — AB (ref 4.0–5.6)

## 2020-10-20 NOTE — Patient Instructions (Addendum)
Plan:  Work on diet, exercise: help sugars, weight, blood pressure If sugars not better, we will need to adjust medications   Urine testing today to rule out infection  If urine looks ok, will send Rx for prostate symptoms If Rx not helping after a few months, may add/change Rx / refer urology   If we need to revisit mental health / anxiety issues, let me know!

## 2020-10-20 NOTE — Progress Notes (Signed)
Roy Meyers is a 50 y.o. male who presents to  [place] [facility]  today, 10/20/20, seeking care for the following:  . DM2 follow-up: A1C today 9.0, last check 3 mos ago was 7.9, has been creeping up over the past 2-3 years. Metformin XR 750 mg daily, Ozempic 0.5 mg Has been slacking on exercise/diet.  . Concern for prostate issue: (+)FH Prostate CA, last PSA checked 06/2020 WNL. Having burning w/ urination about a month. Difficulty emptying bladder, urinary frequency, weak stream. Up once per night to urinate.  . Anxiety/stress has been an issue. Feels a bit chest tightness w/ anxiety, but no CP on exertion. Was on SSRI few years ago but we stopped this, he didn't feel he needed it / didn't like effects.      ASSESSMENT & PLAN with other pertinent findings:  The primary encounter diagnosis was Uncontrolled type 2 diabetes mellitus with hyperglycemia (Roy Meyers). Diagnoses of Pneumococcal vaccination given and Lower urinary tract symptoms (LUTS) were also pertinent to this visit.   Patient Instructions  Plan:  Work on diet, exercise: help sugars, weight, blood pressure If sugars not better, we will need to adjust medications   Urine testing today to rule out infection  If urine looks ok, will send Rx for prostate symptoms If Rx not helping after a few months, may add/change Rx / refer urology   If we need to revisit mental health / anxiety issues, let me know!        Orders Placed This Encounter  Procedures  . Urine Culture  . Pneumococcal polysaccharide vaccine 23-valent greater than or equal to 51yo subcutaneous/IM  . Urine Microscopic  . Microalbumin / creatinine urine ratio  . Ambulatory referral to Ophthalmology  . POCT glycosylated hemoglobin (Hb A1C)    No orders of the defined types were placed in this encounter.    See below for relevant physical exam findings  See below for recent lab and imaging results  reviewed  Medications, allergies, PMH, PSH, SocH, [place] reviewed below    Follow-up instructions: Return in about 3 months (around 01/17/2021) for RECHECK DIABETES & URINARY SYMPTOMS, SEE [place] SOONER IF NEEDED. CALL/MESSAGE W/ QUESTIONS.                                        Exam:  BP 133/87   Pulse 73   Ht 6' 1"  (1.854 m)   Wt 255 lb (115.7 kg)   SpO2 96%   BMI 33.64 kg/m   Constitutional: VS see above. General Appearance: alert, well-developed, well-nourished, NAD  Neck: No masses, trachea midline.   Respiratory: Normal respiratory effort. no wheeze, no rhonchi, no rales  Cardiovascular: S1/S2 normal, no murmur, no rub/gallop auscultated. RRR.   Musculoskeletal: Gait normal. Symmetric and independent movement of all extremities  Neurological: Normal balance/coordination. No tremor.  Skin: warm, dry, intact.   Psychiatric: Normal judgment/insight. Normal mood and affect. Oriented x3.   Current Meds  Medication Sig  . atorvastatin (LIPITOR) 40 MG tablet Take 1 tablet (40 mg total) by mouth daily.  . blood glucose meter kit and supplies KIT Dispense based on patient and insurance preference. Use as directed up to bid. Dx: E11.65  . Insulin Pen Needle (BD PEN NEEDLE NANO U/F) 32G X 4 MM MISC USE UPTO 4 TIMES A DAY  . Insulin Pen Needle (NOVOFINE PLUS)  32G X 4 MM MISC USE AS DIRECTED  . metFORMIN (GLUCOPHAGE-XR) 750 MG 24 hr tablet Take 1 tablet (750 mg total) by mouth daily with breakfast.  . NEEDLE, DISP, 18 G 18G X 1" MISC Use with testosterone as directed to draw  . NEEDLE, DISP, 22 G 22G X 1-1/2" MISC Use with testosterone as directed to inject  . Semaglutide,0.25 or 0.5MG/DOS, (OZEMPIC, 0.25 OR 0.5 MG/DOSE,) 2 MG/1.5ML SOPN Inject 0.5 mg into the skin once a week.  . Syringe, Disposable, (SYRINGE LUER LOCK) 3 ML MISC Use with testosterone as directed  . testosterone cypionate (DEPOTESTOSTERONE CYPIONATE) 200 MG/ML injection INJECT 0.75  MLS (150 MG TOTAL) INTO THE MUSCLE EVERY 7 (SEVEN) DAYS.    No Known Allergies  Patient Active Problem List   Diagnosis Date Noted  . Gastroesophageal reflux disease 04/04/2019  . Other insomnia 04/04/2019  . Testosterone deficiency 10/03/2017  . Hyperlipidemia associated with type 2 diabetes mellitus ([name]) 05/25/2017  . Low testosterone 05/25/2017  . Diabetes type 2, uncontrolled ([name]) 05/11/2017  . Severe episode of recurrent major depressive disorder, without psychotic features ([name]) 05/11/2017    Family History  Problem Relation Age of Onset  . Alcohol abuse Mother   . Cancer Mother   . Diabetes Father     Social History   Tobacco Use  Smoking Status Never Smoker  Smokeless Tobacco Never Used    Past Surgical History:  Procedure Laterality Date  . HERNIA REPAIR    . VASECTOMY      Immunization History  Administered Date(s) Administered  . Influenza,inj,Quad PF,6+ Mos 05/11/2017, 10/03/2018  . Influenza-Unspecified 06/13/2020  . PFIZER(Purple Top)SARS-COV-2 Vaccination 06/03/2020, 07/28/2020  . Pneumococcal Polysaccharide-23 10/20/2020  . Tdap 02/08/2018    Recent Results (from the past 2160 hour(s))  POCT glycosylated hemoglobin (Hb A1C)     Status: Abnormal   Collection Time: 10/20/20  9:57 AM  Result Value Ref Range   Hemoglobin A1C 9.0 (A) 4.0 - 5.6 %   HbA1c POC (<> result, manual entry)     HbA1c, POC (prediabetic range)     HbA1c, POC (controlled diabetic range)      No results found.     All questions at time of visit were answered - patient instructed to contact office with any additional concerns or updates. ER/RTC precautions were reviewed with the patient as applicable.   Please note: manual typing as well as voice recognition software may have been used to produce this document - typos may escape review. Please contact Dr. Sheppard Coil for any needed clarifications.

## 2020-10-22 LAB — URINE CULTURE
MICRO NUMBER:: 11561841
Result:: NO GROWTH
SPECIMEN QUALITY:: ADEQUATE

## 2020-10-22 LAB — URINALYSIS, MICROSCOPIC ONLY
Bacteria, UA: NONE SEEN /HPF
Hyaline Cast: NONE SEEN /LPF
RBC / HPF: NONE SEEN /HPF (ref 0–2)
Squamous Epithelial / HPF: NONE SEEN /HPF (ref <=5)
WBC, UA: NONE SEEN /HPF (ref 0–5)

## 2020-10-22 LAB — MICROALBUMIN / CREATININE URINE RATIO
Creatinine, Urine: 97 mg/dL (ref 20–320)
Microalb Creat Ratio: 16 mcg/mg creat (ref <30)
Microalb, Ur: 1.6 mg/dL

## 2020-10-22 MED ORDER — TAMSULOSIN HCL 0.4 MG PO CAPS: 0.4000 mg | ORAL_CAPSULE | Freq: Every day | ORAL | 3 refills | Status: DC

## 2020-10-22 NOTE — Addendum Note (Signed)
Addended by: Maryla Morrow on: 10/22/2020 10:48 AM   Modules accepted: Orders

## 2020-10-25 ENCOUNTER — Other Ambulatory Visit: Payer: Self-pay | Admitting: Osteopathic Medicine

## 2020-11-12 ENCOUNTER — Ambulatory Visit: Payer: 59 | Admitting: Osteopathic Medicine

## 2020-11-26 LAB — HM DIABETES EYE EXAM

## 2020-12-02 ENCOUNTER — Encounter: Payer: Self-pay | Admitting: Osteopathic Medicine

## 2020-12-13 ENCOUNTER — Other Ambulatory Visit: Payer: Self-pay | Admitting: Osteopathic Medicine

## 2020-12-13 DIAGNOSIS — E349 Endocrine disorder, unspecified: Secondary | ICD-10-CM

## 2021-01-19 ENCOUNTER — Ambulatory Visit: Payer: 59 | Admitting: Osteopathic Medicine

## 2021-01-19 ENCOUNTER — Telehealth: Payer: Self-pay | Admitting: Osteopathic Medicine

## 2021-01-19 NOTE — Telephone Encounter (Signed)
Please call patient: I just wanted to make sure he was ok today since he didn't show up for his appointment today.   Also a reminder that he has had 2 no-shows to appointments at this office within the past year 01/19/21 and 11/12/20. Our clinic policy states that if a patient does not show up to appointments three times, this results in dismissal from the practice.   I'm not dismissing him at this point, just want to make sure everything is ok and remind him of the policy - see below. If he has any additional no-shows, we will be have to follow policy.     NO SHOW  11/12/20 DR Sheppard Coil 01/19/21 DR Sheppard Coil  ANOTHER NO SHOW = DISMISSAL

## 2021-01-21 NOTE — Telephone Encounter (Signed)
Left voicemail for patient to call us back.

## 2021-02-22 ENCOUNTER — Other Ambulatory Visit: Payer: Self-pay | Admitting: Osteopathic Medicine

## 2021-03-31 ENCOUNTER — Telehealth: Payer: Self-pay | Admitting: Osteopathic Medicine

## 2021-03-31 NOTE — Telephone Encounter (Signed)
Patient dropped off paperwork to be filled out, he stated he is changing pharmacies to an online pharmacy and this is what the paperwork is pertaining to. Billing form attached and placed in provider box. AM *03/31/21

## 2021-04-10 ENCOUNTER — Telehealth: Payer: Self-pay

## 2021-04-10 ENCOUNTER — Other Ambulatory Visit: Payer: Self-pay

## 2021-04-10 DIAGNOSIS — E119 Type 2 diabetes mellitus without complications: Secondary | ICD-10-CM

## 2021-04-10 MED ORDER — TAMSULOSIN HCL 0.4 MG PO CAPS: 0.4000 mg | ORAL_CAPSULE | Freq: Every day | ORAL | 0 refills | Status: DC

## 2021-04-10 MED ORDER — ATORVASTATIN CALCIUM 40 MG PO TABS: 40.0000 mg | ORAL_TABLET | Freq: Every day | ORAL | 0 refills | Status: AC

## 2021-04-10 MED ORDER — METFORMIN HCL ER 750 MG PO TB24: 750.0000 mg | ORAL_TABLET | Freq: Every day | ORAL | 0 refills | Status: AC

## 2021-04-10 NOTE — Telephone Encounter (Signed)
Patient requested to have current meds sent to a new m/o pharmacy. Tamsulosin, atorvastatin and metformin forwarded to new pharmacy.   There is a request for tadalafil 20 mg, #90. Rx not listed in active med list.

## 2021-04-14 NOTE — Telephone Encounter (Signed)
Follow-up instructions: Return in about 3 months (around 01/17/2021) for RECHECK DIABETES & URINARY SYMPTOMS, SEE [place] SOONER IF NEEDED. CALL/MESSAGE W/ QUESTIONS.   No refills on tadalafil until seen

## 2021-04-15 NOTE — Telephone Encounter (Signed)
Patient said he does not need this filled. Will call back to make appt per pt.

## 2021-05-04 ENCOUNTER — Other Ambulatory Visit: Payer: Self-pay | Admitting: Osteopathic Medicine

## 2021-05-04 DIAGNOSIS — E349 Endocrine disorder, unspecified: Secondary | ICD-10-CM

## 2021-06-02 ENCOUNTER — Other Ambulatory Visit: Payer: Self-pay | Admitting: Osteopathic Medicine

## 2021-06-06 ENCOUNTER — Other Ambulatory Visit: Payer: Self-pay | Admitting: Osteopathic Medicine

## 2021-06-06 DIAGNOSIS — E349 Endocrine disorder, unspecified: Secondary | ICD-10-CM
